# Patient Record
Sex: Female | Born: 1990 | Race: White | Hispanic: No | Marital: Single | Smoking: Current every day smoker
Health system: Southern US, Community
[De-identification: ages and names within clinical notes are randomized; demographics above are authoritative.]

## PROBLEM LIST (undated history)

## (undated) DIAGNOSIS — F329 Major depressive disorder, single episode, unspecified: Secondary | ICD-10-CM

## (undated) DIAGNOSIS — F1910 Other psychoactive substance abuse, uncomplicated: Secondary | ICD-10-CM

## (undated) DIAGNOSIS — K5903 Drug induced constipation: Secondary | ICD-10-CM

## (undated) DIAGNOSIS — M1990 Unspecified osteoarthritis, unspecified site: Secondary | ICD-10-CM

## (undated) DIAGNOSIS — F32A Depression, unspecified: Secondary | ICD-10-CM

## (undated) HISTORY — PX: NO PAST SURGERIES: SHX2092

---
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
## 2008-01-30 ENCOUNTER — Emergency Department: Payer: Self-pay | Admitting: Emergency Medicine

## 2008-05-05 ENCOUNTER — Ambulatory Visit: Payer: Self-pay | Admitting: Family Medicine

## 2008-07-19 ENCOUNTER — Inpatient Hospital Stay: Payer: Self-pay

## 2009-06-11 ENCOUNTER — Emergency Department: Payer: Self-pay | Admitting: Emergency Medicine

## 2009-10-12 ENCOUNTER — Emergency Department: Payer: Self-pay | Admitting: Internal Medicine

## 2009-11-03 ENCOUNTER — Emergency Department: Payer: Self-pay | Admitting: Emergency Medicine

## 2010-01-05 ENCOUNTER — Observation Stay: Payer: Self-pay | Admitting: Internal Medicine

## 2010-01-05 ENCOUNTER — Inpatient Hospital Stay: Payer: Self-pay | Admitting: Psychiatry
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
## 2014-02-22 ENCOUNTER — Emergency Department: Payer: Self-pay | Admitting: Student

## 2016-11-17 ENCOUNTER — Encounter: Payer: Self-pay | Admitting: Emergency Medicine

## 2016-11-17 ENCOUNTER — Emergency Department
Admission: EM | Admit: 2016-11-17 | Discharge: 2016-11-17 | Disposition: A | Discharge status: Home / Self Care | Payer: Medicaid Other | Attending: Emergency Medicine | Admitting: Emergency Medicine

## 2016-11-17 DIAGNOSIS — S50862A Insect bite (nonvenomous) of left forearm, initial encounter: Secondary | ICD-10-CM | POA: Diagnosis not present

## 2016-11-17 DIAGNOSIS — W57XXXA Bitten or stung by nonvenomous insect and other nonvenomous arthropods, initial encounter: Secondary | ICD-10-CM | POA: Diagnosis not present

## 2016-11-17 DIAGNOSIS — Y939 Activity, unspecified: Secondary | ICD-10-CM | POA: Diagnosis not present

## 2016-11-17 DIAGNOSIS — Y929 Unspecified place or not applicable: Secondary | ICD-10-CM | POA: Insufficient documentation

## 2016-11-17 DIAGNOSIS — R21 Rash and other nonspecific skin eruption: Secondary | ICD-10-CM | POA: Diagnosis present

## 2016-11-17 DIAGNOSIS — Y998 Other external cause status: Secondary | ICD-10-CM | POA: Insufficient documentation

## 2016-11-17 MED ORDER — TRIAMCINOLONE ACETONIDE 0.025 % EX OINT: 1.0000 "application " | TOPICAL_OINTMENT | Freq: Two times a day (BID) | CUTANEOUS | 0 refills | Status: DC

## 2016-11-17 MED ORDER — DIPHENHYDRAMINE HCL 25 MG PO CAPS: 25.0000 mg | ORAL_CAPSULE | ORAL | 0 refills | Status: DC | PRN

## 2016-11-17 MED ORDER — PERMETHRIN 5 % EX CREA: TOPICAL_CREAM | CUTANEOUS | 1 refills | Status: AC

## 2016-11-17 MED ORDER — PREDNISONE 20 MG PO TABS
60.0000 mg | ORAL_TABLET | Freq: Once | ORAL | Status: AC
Start: 2016-11-17 — End: 2016-11-17
  Administered 2016-11-17: 60 mg via ORAL
  Filled 2016-11-17: qty 3

## 2016-11-17 NOTE — ED Notes (Signed)
E-signature pad unavailable at this time.  Patient verbalized understanding of all discharge instructions and prescriptions.  

## 2016-11-17 NOTE — ED Triage Notes (Signed)
Rash to right forearm x 1 day

## 2016-11-17 NOTE — ED Provider Notes (Signed)
[facility] Emergency Department Provider Note  ____________________________________________  Time seen: Approximately 7:23 PM  I have reviewed the triage vital signs and the nursing notes.   HISTORY  Chief Complaint Rash    HPI Ashley Choi is a 26 y.o. female that presents to the emergency department with rash overleft forearm for 1 day. Rash itches. Patient states that daughter and partner have a similar rash. Daughter's rash began after coming home from father's house on Wednesday. Daughter was exposed to a dog with mange. She denies fever, shortness breath, chest pain, nausea, vomiting, abdominal pain.   History reviewed. No pertinent past medical history.  There are no active problems to display for this patient.   History reviewed. No pertinent surgical history.  Prior to Admission medications   Medication Sig Start Date End Date Taking? Authorizing Provider  diphenhydrAMINE (BENADRYL) 25 mg capsule Take 1 capsule (25 mg total) by mouth every 4 (four) hours as needed. 11/17/16 11/17/17  Enid Derry, PA-C  permethrin (ELIMITE) 5 % cream Message cream over area and leave on for 8-14 hours before removing. Repeat in 14 days if rash persists. 11/17/16 12/01/16  Enid Derry, PA-C  triamcinolone (KENALOG) 0.025 % ointment Apply 1 application topically 2 (two) times daily. 11/17/16   Enid Derry, PA-C    Allergies Patient has no known allergies.  No family history on file.  Social History Social History  Substance Use Topics  . Smoking status: Never Smoker  . Smokeless tobacco: Never Used  . Alcohol use Not on file     Review of Systems  Constitutional: No fever/chills Cardiovascular: No chest pain. Respiratory: No cough. No SOB. Gastrointestinal: No abdominal pain.  No nausea, no vomiting.  Musculoskeletal: Negative for musculoskeletal pain. Skin: Negative for abrasions, lacerations, ecchymosis. Positive for rash. Neurological:  Negative for headaches, numbness or tingling   ____________________________________________   PHYSICAL EXAM:  VITAL SIGNS: ED Triage Vitals [11/17/16 1821]  Enc Vitals Group     BP      Pulse      Resp      Temp      Temp src      SpO2      Weight 150 lb (68 kg)     Height 5\' 1"  (1.549 m)     Head Circumference      Peak Flow      Pain Score      Pain Loc      Pain Edu?      Excl. in GC?      Constitutional: Alert and oriented. Well appearing and in no acute distress. Eyes: Conjunctivae are normal. PERRL. EOMI. Head: Atraumatic. ENT:      Ears:      Nose: No congestion/rhinnorhea.      Mouth/Throat: Mucous membranes are moist.  Neck: No stridor.   Cardiovascular: Normal rate, regular rhythm.  Good peripheral circulation. Respiratory: Normal respiratory effort without tachypnea or retractions. Lungs CTAB. Good air entry to the bases with no decreased or absent breath sounds. Musculoskeletal: Full range of motion to all extremities. No gross deformities appreciated. Neurologic:  Normal speech and language. No gross focal neurologic deficits are appreciated.  Skin:  Skin is warm, dry and intact. Erythematous papules to left forearm. Psychiatric: Mood and affect are normal. Speech and behavior are normal. Patient exhibits appropriate insight and judgement.   ____________________________________________   LABS (all labs ordered are listed, but only abnormal results are displayed)  Labs Reviewed - No data to  display ____________________________________________  EKG   ____________________________________________  RADIOLOGY  No results found.  ____________________________________________    PROCEDURES  Procedure(s) performed:    Procedures    Medications  predniSONE (DELTASONE) tablet 60 mg (60 mg Oral Given 11/17/16 1938)     ____________________________________________   INITIAL IMPRESSION / ASSESSMENT AND PLAN / ED COURSE  Pertinent labs &  imaging results that were available during my care of the patient were reviewed by me and considered in my medical decision making (see chart for details).  Review of the [place] CSRS was performed in accordance of the [facility] prior to dispensing any controlled drugs.   Patient presented to the emergency department for evaluation of rash for 1 day. Vital signs and exam are reassuring. Daughter and partner have a similar rash. Daughter was exposed to a dog with mange. Daughter's rash consistent with scabies. I will cover patient for scabies. Patient will be discharged home with prescriptions for permethrin, triamcinolone, and Benadryl. Patient is to follow up with PCP as directed. Patient is given ED precautions to return to the ED for any worsening or new symptoms.     ____________________________________________  FINAL CLINICAL IMPRESSION(S) / ED DIAGNOSES  Final diagnoses:  Bug bite, initial encounter      NEW MEDICATIONS STARTED DURING THIS VISIT:  Discharge Medication List as of 11/17/2016  7:43 PM    START taking these medications   Details  diphenhydrAMINE (BENADRYL) 25 mg capsule Take 1 capsule (25 mg total) by mouth every 4 (four) hours as needed., Starting Fri 11/17/2016, Until Sat 11/17/2017, Print    permethrin (ELIMITE) 5 % cream Message cream over area and leave on for 8-14 hours before removing. Repeat in 14 days if rash persists., Print    triamcinolone (KENALOG) 0.025 % ointment Apply 1 application topically 2 (two) times daily., Starting Fri 11/17/2016, Print            This chart was dictated using voice recognition software/Dragon. Despite best efforts to proofread, errors can occur which can change the meaning. Any change was purely unintentional.    Enid DerryWagner, Jaquane Boughner, PA-C 11/17/16 2157    Nita SickleVeronese, [place], MD 11/17/16 [phone]

## 2017-07-13 ENCOUNTER — Other Ambulatory Visit: Payer: Self-pay

## 2017-07-13 ENCOUNTER — Emergency Department
Admission: EM | Admit: 2017-07-13 | Discharge: 2017-07-13 | Disposition: A | Discharge status: Home / Self Care | Payer: Medicaid Other | Attending: Emergency Medicine | Admitting: Emergency Medicine

## 2017-07-13 ENCOUNTER — Encounter: Payer: Self-pay | Admitting: Emergency Medicine

## 2017-07-13 DIAGNOSIS — R509 Fever, unspecified: Secondary | ICD-10-CM | POA: Insufficient documentation

## 2017-07-13 DIAGNOSIS — Z5321 Procedure and treatment not carried out due to patient leaving prior to being seen by health care provider: Secondary | ICD-10-CM | POA: Insufficient documentation

## 2017-07-13 DIAGNOSIS — R197 Diarrhea, unspecified: Secondary | ICD-10-CM | POA: Diagnosis not present

## 2017-07-13 DIAGNOSIS — R109 Unspecified abdominal pain: Secondary | ICD-10-CM | POA: Diagnosis present

## 2017-07-13 DIAGNOSIS — M791 Myalgia, unspecified site: Secondary | ICD-10-CM | POA: Insufficient documentation

## 2017-07-13 DIAGNOSIS — R05 Cough: Secondary | ICD-10-CM | POA: Insufficient documentation

## 2017-07-13 DIAGNOSIS — R112 Nausea with vomiting, unspecified: Secondary | ICD-10-CM | POA: Diagnosis not present

## 2017-07-13 LAB — CBC
HCT: 40.6 % (ref 35.0–47.0)
Hemoglobin: 14 g/dL (ref 12.0–16.0)
MCH: 29.8 pg (ref 26.0–34.0)
MCHC: 34.4 g/dL (ref 32.0–36.0)
MCV: 86.5 fL (ref 80.0–100.0)
PLATELETS: 463 10*3/uL — AB (ref 150–440)
RBC: 4.69 MIL/uL (ref 3.80–5.20)
RDW: 13.6 % (ref 11.5–14.5)
WBC: 13.1 10*3/uL — ABNORMAL HIGH (ref 3.6–11.0)

## 2017-07-13 LAB — URINALYSIS, COMPLETE (UACMP) WITH MICROSCOPIC
Bacteria, UA: NONE SEEN
Bilirubin Urine: NEGATIVE
GLUCOSE, UA: NEGATIVE mg/dL
KETONES UR: 5 mg/dL — AB
Nitrite: NEGATIVE
PH: 7 (ref 5.0–8.0)
Protein, ur: 30 mg/dL — AB
SPECIFIC GRAVITY, URINE: 1.034 — AB (ref 1.005–1.030)

## 2017-07-13 LAB — COMPREHENSIVE METABOLIC PANEL
ALK PHOS: 73 U/L (ref 38–126)
ALT: 21 U/L (ref 14–54)
AST: 25 U/L (ref 15–41)
Albumin: 4 g/dL (ref 3.5–5.0)
Anion gap: 11 (ref 5–15)
BUN: 11 mg/dL (ref 6–20)
CALCIUM: 9 mg/dL (ref 8.9–10.3)
CHLORIDE: 103 mmol/L (ref 101–111)
CO2: 22 mmol/L (ref 22–32)
CREATININE: 0.59 mg/dL (ref 0.44–1.00)
GFR calc non Af Amer: >60 mL/min (ref >60)
Glucose, Bld: 139 mg/dL — ABNORMAL HIGH (ref 65–99)
Potassium: 3.7 mmol/L (ref 3.5–5.1)
SODIUM: 136 mmol/L (ref 135–145)
Total Bilirubin: 0.7 mg/dL (ref 0.3–1.2)
Total Protein: 7.9 g/dL (ref 6.5–8.1)

## 2017-07-13 LAB — LIPASE, BLOOD: LIPASE: 23 U/L (ref 11–51)

## 2017-07-13 LAB — POC URINE PREG, ED: Preg Test, Ur: NEGATIVE

## 2017-07-13 LAB — TROPONIN I

## 2017-07-13 NOTE — ED Notes (Signed)
FN: pt notifies this RN that she has to leave to go pick up her daughter. Pt plans on coming back this evening. NAD.

## 2017-07-13 NOTE — ED Triage Notes (Signed)
Pt to ED from home with multiple complaints, generalized body aches, abd pain, productive white cough, n/v/d, and fevers that all started today when she woke up.  Taken IBU last this morning.

## 2017-07-16 ENCOUNTER — Telehealth: Payer: Self-pay | Admitting: Emergency Medicine

## 2017-07-16 NOTE — Telephone Encounter (Signed)
Called patient due to lwot to inquire about condition and follow up plans.No answer and voicemail is full. °

## 2017-08-11 ENCOUNTER — Emergency Department
Admission: EM | Admit: 2017-08-11 | Discharge: 2017-08-12 | Disposition: A | Discharge status: Home / Self Care | Payer: Medicaid Other | Attending: Emergency Medicine | Admitting: Emergency Medicine

## 2017-08-11 DIAGNOSIS — F17210 Nicotine dependence, cigarettes, uncomplicated: Secondary | ICD-10-CM | POA: Diagnosis not present

## 2017-08-11 DIAGNOSIS — Z79899 Other long term (current) drug therapy: Secondary | ICD-10-CM | POA: Insufficient documentation

## 2017-08-11 DIAGNOSIS — R45851 Suicidal ideations: Secondary | ICD-10-CM | POA: Insufficient documentation

## 2017-08-11 DIAGNOSIS — F10929 Alcohol use, unspecified with intoxication, unspecified: Secondary | ICD-10-CM | POA: Diagnosis not present

## 2017-08-11 DIAGNOSIS — F329 Major depressive disorder, single episode, unspecified: Secondary | ICD-10-CM | POA: Diagnosis present

## 2017-08-11 DIAGNOSIS — F1910 Other psychoactive substance abuse, uncomplicated: Secondary | ICD-10-CM | POA: Insufficient documentation

## 2017-08-11 DIAGNOSIS — F29 Unspecified psychosis not due to a substance or known physiological condition: Secondary | ICD-10-CM | POA: Insufficient documentation

## 2017-08-11 LAB — URINE DRUG SCREEN, QUALITATIVE (ARMC ONLY)
Amphetamines, Ur Screen: NOT DETECTED
BARBITURATES, UR SCREEN: NOT DETECTED
BENZODIAZEPINE, UR SCRN: POSITIVE — AB
CANNABINOID 50 NG, UR ~~LOC~~: POSITIVE — AB
Cocaine Metabolite,Ur ~~LOC~~: NOT DETECTED
MDMA (ECSTASY) UR SCREEN: NOT DETECTED
Methadone Scn, Ur: NOT DETECTED
Opiate, Ur Screen: POSITIVE — AB
Phencyclidine (PCP) Ur S: NOT DETECTED
TRICYCLIC, UR SCREEN: NOT DETECTED

## 2017-08-11 LAB — COMPREHENSIVE METABOLIC PANEL
ALK PHOS: 79 U/L (ref 38–126)
ALT: 13 U/L — ABNORMAL LOW (ref 14–54)
ANION GAP: 8 (ref 5–15)
AST: 17 U/L (ref 15–41)
Albumin: 4.1 g/dL (ref 3.5–5.0)
BILIRUBIN TOTAL: 0.5 mg/dL (ref 0.3–1.2)
BUN: <5 mg/dL — ABNORMAL LOW (ref 6–20)
CO2: 25 mmol/L (ref 22–32)
Calcium: 8.5 mg/dL — ABNORMAL LOW (ref 8.9–10.3)
Chloride: 104 mmol/L (ref 101–111)
Creatinine, Ser: 0.49 mg/dL (ref 0.44–1.00)
GFR calc non Af Amer: >60 mL/min (ref >60)
GLUCOSE: 106 mg/dL — AB (ref 65–99)
Potassium: 3.1 mmol/L — ABNORMAL LOW (ref 3.5–5.1)
Sodium: 137 mmol/L (ref 135–145)
TOTAL PROTEIN: 7.3 g/dL (ref 6.5–8.1)

## 2017-08-11 LAB — CBC
HCT: 38.1 % (ref 35.0–47.0)
Hemoglobin: 13.6 g/dL (ref 12.0–16.0)
MCH: 30.5 pg (ref 26.0–34.0)
MCHC: 35.7 g/dL (ref 32.0–36.0)
MCV: 85.5 fL (ref 80.0–100.0)
PLATELETS: 398 10*3/uL (ref 150–440)
RBC: 4.45 MIL/uL (ref 3.80–5.20)
RDW: 13.7 % (ref 11.5–14.5)
WBC: 13.2 10*3/uL — ABNORMAL HIGH (ref 3.6–11.0)

## 2017-08-11 LAB — ETHANOL: Alcohol, Ethyl (B): 166 mg/dL — ABNORMAL HIGH (ref <10)

## 2017-08-11 LAB — PREGNANCY, URINE: Preg Test, Ur: NEGATIVE

## 2017-08-11 MED ORDER — NICOTINE 21 MG/24HR TD PT24
21.0000 mg | MEDICATED_PATCH | Freq: Once | TRANSDERMAL | Status: DC
  Administered 2017-08-11: 21 mg via TRANSDERMAL
  Filled 2017-08-11: qty 1

## 2017-08-11 MED ORDER — CHLORDIAZEPOXIDE HCL 10 MG PO CAPS: 10.0000 mg | ORAL_CAPSULE | Freq: Three times a day (TID) | ORAL | Status: DC | PRN

## 2017-08-11 NOTE — ED Notes (Signed)
BEHAVIORAL HEALTH ROUNDING  Patient sleeping: No.  Patient alert and oriented: yes  Behavior appropriate: Yes. ; If no, describe:  Nutrition and fluids offered: Yes  Toileting and hygiene offered: Yes  Sitter present: not applicable, Q 15 min safety rounds and observation.  Law enforcement present: Yes [facility]  
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
## 2017-08-11 NOTE — ED Notes (Signed)

## 2017-08-11 NOTE — ED Triage Notes (Signed)
Patient reports today she has had Xanax, Klonopin, heroin, and alcohol.

## 2017-08-11 NOTE — ED Notes (Signed)
Pt changed into behavorial clothing by this tech and EDT Kadijah, pt belongings placed in a belongings bag and handed off to RN Dewayne HatchAnn, pt belongings listed on "securing of belongings" sheet.

## 2017-08-11 NOTE — ED Provider Notes (Addendum)
[facility] Emergency Department Provider Note  ____________________________________________  Time seen: Approximately 8:51 PM  I have reviewed the triage vital signs and the nursing notes.   HISTORY  Chief Complaint No chief complaint on file.    HPI Ashley Choi is a 27 y.o. female comes to the ED complaining of depression. She reports waking up crying every day for the past 2 weeks, anhedonia, difficulty sleeping, decreased appetite, depressed mood. Suicidal ideation without plan. Symptoms of been constant, waxing and waning, no aggravating or alleviating factors, severe.  patient also reports auditory hallucinations and feeling paranoid and afraid to go out of her house.  She reports that she is to be on antidepressants but stopped taking them a while ago.  Also drinks alcohol, snorts heroin, abuses Xanax whenever she can get it which is not every day. Last heroin was today. Last alcohol was today  Patient reports feeling anxious was causes her chest to be tight.      History reviewed. No pertinent past medical history.   There are no active problems to display for this patient.    History reviewed. No pertinent surgical history.   Prior to Admission medications   Medication Sig Start Date End Date Taking? Authorizing Provider  diphenhydrAMINE (BENADRYL) 25 mg capsule Take 1 capsule (25 mg total) by mouth every 4 (four) hours as needed. 11/17/16 11/17/17  Enid Derry, PA-C  triamcinolone (KENALOG) 0.025 % ointment Apply 1 application topically 2 (two) times daily. 11/17/16   Enid Derry, PA-C     Allergies Patient has no known allergies.   No family history on file.  Social History Social History   Tobacco Use  . Smoking status: Current Every Day Smoker    Packs/day: 0.50    Types: Cigarettes  . Smokeless tobacco: Never Used  Substance Use Topics  . Alcohol use: No    Frequency: Never  . Drug use: Yes    Comment: heroin     Review of Systems  Constitutional:   No fever or chills.  ENT:   No sore throat. No rhinorrhea. Cardiovascular:  positive chest tightness as above, no syncope. Respiratory:   No dyspnea or cough. Gastrointestinal:   Negative for abdominal pain, vomiting and diarrhea.  Musculoskeletal:   Negative for focal pain or swelling All other systems reviewed and are negative except as documented above in ROS and HPI.  ____________________________________________   PHYSICAL EXAM:  VITAL SIGNS: ED Triage Vitals  Enc Vitals Group     BP 08/11/17 1941 140/76     Pulse Rate 08/11/17 1941 (!) 112     Resp 08/11/17 1941 18     Temp 08/11/17 1941 98.4 F (36.9 C)     Temp Source 08/11/17 1941 Oral     SpO2 08/11/17 1941 95 %     Weight 08/11/17 1941 170 lb (77.1 kg)     Height 08/11/17 1941 5\' 1"  (1.549 m)     Head Circumference --      Peak Flow --      Pain Score 08/11/17 1952 0     Pain Loc --      Pain Edu? --      Excl. in GC? --     Vital signs reviewed, nursing assessments reviewed.   Constitutional:   Alert and oriented. Well appearing and in no distress. tearful Eyes:   Conjunctivae are normal. EOMI. PERRL. ENT      Head:   Normocephalic and atraumatic.  Nose:   No congestion/rhinnorhea.       Mouth/Throat:   MMM, no pharyngeal erythema. No peritonsillar mass.       Neck:   No meningismus. Full ROM. Hematological/Lymphatic/Immunilogical:   No cervical lymphadenopathy. Cardiovascular:   RRR. Symmetric bilateral radial and DP pulses.  No murmurs.  Respiratory:   Normal respiratory effort without tachypnea/retractions. Breath sounds are clear and equal bilaterally. No wheezes/rales/rhonchi. Gastrointestinal:   Soft and nontender. Non distended. There is no CVA tenderness.  No rebound, rigidity, or guarding. Genitourinary:   deferred Musculoskeletal:   Normal range of motion in all extremities. No joint effusions.  No lower extremity tenderness.  No edema. Neurologic:    slurred speech.  Motor grossly intact. No acute focal neurologic deficits are appreciated.  Skin:    Skin is warm, dry and intact. No rash noted.  No petechiae, purpura, or bullae.no apparent injuries  ____________________________________________    LABS (pertinent positives/negatives) (all labs ordered are listed, but only abnormal results are displayed) Labs Reviewed  COMPREHENSIVE METABOLIC PANEL - Abnormal; Notable for the following components:      Result Value   Potassium 3.1 (*)    Glucose, Bld 106 (*)    BUN <5 (*)    Calcium 8.5 (*)    ALT 13 (*)    All other components within normal limits  ETHANOL - Abnormal; Notable for the following components:   Alcohol, Ethyl (B) 166 (*)    All other components within normal limits  CBC - Abnormal; Notable for the following components:   WBC 13.2 (*)    All other components within normal limits  URINE DRUG SCREEN, QUALITATIVE ([facility] ONLY)  PREGNANCY, URINE  POC URINE PREG, ED   ____________________________________________   EKG  interpreted by me Normal sinus rhythm rate of 98, normal axis intervals QRS ST segments and T waves.  ____________________________________________    RADIOLOGY  No results found.  ____________________________________________   PROCEDURES Procedures  ____________________________________________  DIFFERENTIAL DIAGNOSIS   psychosis, sepsis induced psychiatric disorder, polysubstance abuse, major depression  CLINICAL IMPRESSION / ASSESSMENT AND PLAN / ED COURSE  Pertinent labs & imaging results that were available during my care of the patient were reviewed by me and considered in my medical decision making (see chart for details).    patient well appearing not in distress, medically stable, unremarkable vital signs. Triage vitals found her tachycardic at 112, but this is not reproduced on my exam, her heart rate for me is about 90. She is anxious appearing and emotionally upset. Labs  show elevated alcohol level, she does exhibit some signs of intoxication. We will retain her in the ED until she is sober and can pursue a psychiatry evaluation. Involuntary commitment in the meantime given polysubstance abuse and danger to herself and psychotic symptoms.  Clinical Course as of Aug 12 2103  Sat Aug 11, 2017  2105 Tox screen positive for opiate, benzo, marijuana. Alcohol elevated.    [PS]    Clinical Course User Index [PS] Sharman CheekStafford, Trishia Cuthrell, MD     ____________________________________________   FINAL CLINICAL IMPRESSION(S) / ED DIAGNOSES    Final diagnoses:  Suicidal ideation  Polysubstance abuse (HCC)  Alcoholic intoxication with complication (HCC)  Psychosis, unspecified psychosis type [facility](HCC)     ED Discharge Orders    None      Portions of this note were generated with dragon dictation software. Dictation errors may occur despite best attempts at proofreading.    Sharman CheekStafford, Marlo Arriola, MD 08/11/17 2056  Sharman Cheek, MD 08/11/17 2105

## 2017-08-12 NOTE — ED Notes (Signed)
BEHAVIORAL HEALTH ROUNDING  Patient sleeping: No.  Patient alert and oriented: yes  Behavior appropriate: Yes. ; If no, describe:  Nutrition and fluids offered: Yes  Toileting and hygiene offered: Yes  Sitter present: not applicable, Q 15 min safety rounds and observation.  Law enforcement present: Yes [facility]  

## 2017-08-12 NOTE — ED Provider Notes (Signed)
-----------------------------------------   1:26 AM on 08/12/2017 -----------------------------------------  [facility] Dr[facility] Orpah Clintonollin who has rescinded her IVC and deems her psychiatrically stable for discharge home with mental health/substance abuse follow-up.  No medication recommendations at this time.   Irean HongSung, Jade J, MD 08/12/17 [phone]

## 2017-08-12 NOTE — Discharge Instructions (Addendum)
Please call the number provided to seek counseling for substance abuse and depression.  Return to the ER for worsening symptoms, persistent vomiting, difficulty breathing, feelings of hurting yourself or others, or other concerns.

## 2017-09-26 ENCOUNTER — Emergency Department: Payer: Medicaid Other

## 2017-09-26 ENCOUNTER — Encounter: Payer: Self-pay | Admitting: Emergency Medicine

## 2017-09-26 ENCOUNTER — Inpatient Hospital Stay
Admission: EM | Admit: 2017-09-26 | Discharge: 2017-09-28 | DRG: 917 | Disposition: A | Discharge status: Home / Self Care | Payer: Medicaid Other | Attending: Internal Medicine | Admitting: Internal Medicine

## 2017-09-26 ENCOUNTER — Inpatient Hospital Stay: Payer: Medicaid Other

## 2017-09-26 DIAGNOSIS — G92 Toxic encephalopathy: Secondary | ICD-10-CM | POA: Diagnosis present

## 2017-09-26 DIAGNOSIS — F1910 Other psychoactive substance abuse, uncomplicated: Secondary | ICD-10-CM | POA: Diagnosis present

## 2017-09-26 DIAGNOSIS — T43621A Poisoning by amphetamines, accidental (unintentional), initial encounter: Secondary | ICD-10-CM | POA: Diagnosis present

## 2017-09-26 DIAGNOSIS — T407X1A Poisoning by cannabis (derivatives), accidental (unintentional), initial encounter: Secondary | ICD-10-CM | POA: Diagnosis present

## 2017-09-26 DIAGNOSIS — I959 Hypotension, unspecified: Secondary | ICD-10-CM | POA: Diagnosis present

## 2017-09-26 DIAGNOSIS — J9600 Acute respiratory failure, unspecified whether with hypoxia or hypercapnia: Secondary | ICD-10-CM

## 2017-09-26 DIAGNOSIS — G9340 Encephalopathy, unspecified: Secondary | ICD-10-CM

## 2017-09-26 DIAGNOSIS — T424X1A Poisoning by benzodiazepines, accidental (unintentional), initial encounter: Principal | ICD-10-CM | POA: Diagnosis present

## 2017-09-26 DIAGNOSIS — T405X1A Poisoning by cocaine, accidental (unintentional), initial encounter: Secondary | ICD-10-CM | POA: Diagnosis present

## 2017-09-26 DIAGNOSIS — F17210 Nicotine dependence, cigarettes, uncomplicated: Secondary | ICD-10-CM | POA: Diagnosis present

## 2017-09-26 DIAGNOSIS — T40601A Poisoning by unspecified narcotics, accidental (unintentional), initial encounter: Secondary | ICD-10-CM | POA: Diagnosis present

## 2017-09-26 DIAGNOSIS — R4182 Altered mental status, unspecified: Secondary | ICD-10-CM | POA: Diagnosis present

## 2017-09-26 DIAGNOSIS — T884XXA Failed or difficult intubation, initial encounter: Secondary | ICD-10-CM

## 2017-09-26 LAB — AMMONIA: Ammonia: 23 umol/L (ref 9–35)

## 2017-09-26 LAB — ACETAMINOPHEN LEVEL: Acetaminophen (Tylenol), Serum: <10 ug/mL — ABNORMAL LOW (ref 10–30)

## 2017-09-26 LAB — CBC
HCT: 38 % (ref 35.0–47.0)
Hemoglobin: 13.1 g/dL (ref 12.0–16.0)
MCH: 29.9 pg (ref 26.0–34.0)
MCHC: 34.5 g/dL (ref 32.0–36.0)
MCV: 86.7 fL (ref 80.0–100.0)
PLATELETS: 399 10*3/uL (ref 150–440)
RBC: 4.38 MIL/uL (ref 3.80–5.20)
RDW: 14.5 % (ref 11.5–14.5)
WBC: 8 10*3/uL (ref 3.6–11.0)

## 2017-09-26 LAB — URINALYSIS, COMPLETE (UACMP) WITH MICROSCOPIC
Bacteria, UA: NONE SEEN
Bilirubin Urine: NEGATIVE
GLUCOSE, UA: NEGATIVE mg/dL
Ketones, ur: 20 mg/dL — AB
LEUKOCYTES UA: NEGATIVE
NITRITE: NEGATIVE
Protein, ur: 100 mg/dL — AB
Specific Gravity, Urine: 1.027 (ref 1.005–1.030)
pH: 5 (ref 5.0–8.0)

## 2017-09-26 LAB — BLOOD GAS, ARTERIAL
Acid-base deficit: 1.1 mmol/L (ref 0.0–2.0)
Bicarbonate: 22.4 mmol/L (ref 20.0–28.0)
Drawn by: 28459
FIO2: 100
O2 Saturation: 100 %
PCO2 ART: 33 mmHg (ref 32.0–48.0)
PEEP: 5 cmH2O
Patient temperature: 37
RATE: 16 resp/min
VT: 450 mL
pH, Arterial: 7.44 (ref 7.350–7.450)
pO2, Arterial: 438 mmHg — ABNORMAL HIGH (ref 83.0–108.0)

## 2017-09-26 LAB — COMPREHENSIVE METABOLIC PANEL
ALK PHOS: 75 U/L (ref 38–126)
ALT: 11 U/L — ABNORMAL LOW (ref 14–54)
ANION GAP: 5 (ref 5–15)
AST: 19 U/L (ref 15–41)
Albumin: 3.8 g/dL (ref 3.5–5.0)
BUN: 8 mg/dL (ref 6–20)
CALCIUM: 8.8 mg/dL — AB (ref 8.9–10.3)
CO2: 25 mmol/L (ref 22–32)
Chloride: 109 mmol/L (ref 101–111)
Creatinine, Ser: 0.6 mg/dL (ref 0.44–1.00)
GFR calc non Af Amer: >60 mL/min (ref >60)
Glucose, Bld: 115 mg/dL — ABNORMAL HIGH (ref 65–99)
POTASSIUM: 4 mmol/L (ref 3.5–5.1)
SODIUM: 139 mmol/L (ref 135–145)
Total Bilirubin: 0.6 mg/dL (ref 0.3–1.2)
Total Protein: 7.2 g/dL (ref 6.5–8.1)

## 2017-09-26 LAB — URINE DRUG SCREEN, QUALITATIVE (ARMC ONLY)
AMPHETAMINES, UR SCREEN: POSITIVE — AB
BENZODIAZEPINE, UR SCRN: POSITIVE — AB
Barbiturates, Ur Screen: NOT DETECTED
Cannabinoid 50 Ng, Ur ~~LOC~~: POSITIVE — AB
Cocaine Metabolite,Ur ~~LOC~~: POSITIVE — AB
MDMA (ECSTASY) UR SCREEN: NOT DETECTED
METHADONE SCREEN, URINE: NOT DETECTED
OPIATE, UR SCREEN: POSITIVE — AB
Phencyclidine (PCP) Ur S: NOT DETECTED
Tricyclic, Ur Screen: NOT DETECTED

## 2017-09-26 LAB — CK: CK TOTAL: 120 U/L (ref 38–234)

## 2017-09-26 LAB — BASIC METABOLIC PANEL
Anion gap: 6 (ref 5–15)
BUN: 6 mg/dL (ref 6–20)
CO2: 22 mmol/L (ref 22–32)
CREATININE: 0.58 mg/dL (ref 0.44–1.00)
Calcium: 8.5 mg/dL — ABNORMAL LOW (ref 8.9–10.3)
Chloride: 118 mmol/L — ABNORMAL HIGH (ref 101–111)
GFR calc Af Amer: >60 mL/min (ref >60)
GFR calc non Af Amer: >60 mL/min (ref >60)
Glucose, Bld: 109 mg/dL — ABNORMAL HIGH (ref 65–99)
Potassium: 3.3 mmol/L — ABNORMAL LOW (ref 3.5–5.1)
SODIUM: 146 mmol/L — AB (ref 135–145)

## 2017-09-26 LAB — MRSA PCR SCREENING: MRSA BY PCR: NEGATIVE

## 2017-09-26 LAB — LACTIC ACID, PLASMA: Lactic Acid, Venous: 1.2 mmol/L (ref 0.5–1.9)

## 2017-09-26 LAB — POCT PREGNANCY, URINE: PREG TEST UR: NEGATIVE

## 2017-09-26 LAB — ETHANOL: Alcohol, Ethyl (B): <10 mg/dL (ref <10)

## 2017-09-26 LAB — GLUCOSE, CAPILLARY: GLUCOSE-CAPILLARY: 114 mg/dL — AB (ref 65–99)

## 2017-09-26 MED ORDER — LORAZEPAM 2 MG/ML IJ SOLN
1.0000 mg | Freq: Once | INTRAMUSCULAR | Status: AC
Start: 2017-09-26 — End: 2017-09-26
  Administered 2017-09-26: 1 mg via INTRAVENOUS

## 2017-09-26 MED ORDER — MIDAZOLAM HCL 5 MG/5ML IJ SOLN
INTRAMUSCULAR | Status: AC
Start: 2017-09-26 — End: 2017-09-27
  Filled 2017-09-26: qty 5

## 2017-09-26 MED ORDER — HALOPERIDOL LACTATE 5 MG/ML IJ SOLN
3.0000 mg | Freq: Once | INTRAMUSCULAR | Status: AC
  Administered 2017-09-26: 3 mg via INTRAVENOUS

## 2017-09-26 MED ORDER — ETOMIDATE 2 MG/ML IV SOLN
20.0000 mg | Freq: Once | INTRAVENOUS | Status: AC
  Administered 2017-09-26: 20 mg via INTRAVENOUS

## 2017-09-26 MED ORDER — THIAMINE HCL 100 MG/ML IJ SOLN
Freq: Once | INTRAVENOUS | Status: AC
  Administered 2017-09-26: 12:00:00 via INTRAVENOUS
  Filled 2017-09-26: qty 1000

## 2017-09-26 MED ORDER — HALOPERIDOL LACTATE 5 MG/ML IJ SOLN
2.0000 mg | Freq: Once | INTRAMUSCULAR | Status: AC
  Administered 2017-09-26: 2 mg via INTRAVENOUS
  Filled 2017-09-26: qty 1

## 2017-09-26 MED ORDER — SODIUM CHLORIDE 0.9 % IV BOLUS
1000.0000 mL | Freq: Once | INTRAVENOUS | Status: AC
  Administered 2017-09-26: 1000 mL via INTRAVENOUS

## 2017-09-26 MED ORDER — ENOXAPARIN SODIUM 40 MG/0.4ML ~~LOC~~ SOLN
40.0000 mg | SUBCUTANEOUS | Status: DC
  Administered 2017-09-26 – 2017-09-27 (×2): 40 mg via SUBCUTANEOUS
  Filled 2017-09-26 (×2): qty 0.4

## 2017-09-26 MED ORDER — DEXMEDETOMIDINE HCL IN NACL 400 MCG/100ML IV SOLN
0.4000 ug/kg/h | INTRAVENOUS | Status: DC
Start: 2017-09-26 — End: 2017-09-27
  Administered 2017-09-26: 0.5 ug/kg/h via INTRAVENOUS
  Filled 2017-09-26: qty 100

## 2017-09-26 MED ORDER — FENTANYL CITRATE (PF) 100 MCG/2ML IJ SOLN
100.0000 ug | INTRAMUSCULAR | Status: DC | PRN
  Administered 2017-09-26 – 2017-09-27 (×3): 100 ug via INTRAVENOUS
  Filled 2017-09-26 (×3): qty 2

## 2017-09-26 MED ORDER — MIDAZOLAM HCL 2 MG/2ML IJ SOLN
2.0000 mg | Freq: Once | INTRAMUSCULAR | Status: AC
  Administered 2017-09-26: 2 mg via INTRAVENOUS

## 2017-09-26 MED ORDER — LORAZEPAM 2 MG/ML IJ SOLN
2.0000 mg | Freq: Once | INTRAMUSCULAR | Status: AC
  Administered 2017-09-26: 2 mg via INTRAVENOUS

## 2017-09-26 MED ORDER — LORAZEPAM 2 MG/ML IJ SOLN
INTRAMUSCULAR | Status: AC
Start: 2017-09-26 — End: 2017-09-27
  Filled 2017-09-26: qty 1

## 2017-09-26 MED ORDER — SODIUM CHLORIDE 0.9 % IV SOLN
INTRAVENOUS | Status: DC
  Administered 2017-09-26: 125 mL/h via INTRAVENOUS

## 2017-09-26 MED ORDER — LORAZEPAM 2 MG/ML IJ SOLN
INTRAMUSCULAR | Status: AC
  Administered 2017-09-26: 1 mg via INTRAVENOUS
  Filled 2017-09-26: qty 1

## 2017-09-26 MED ORDER — ZIPRASIDONE MESYLATE 20 MG IM SOLR
20.0000 mg | Freq: Once | INTRAMUSCULAR | Status: AC
  Administered 2017-09-26: 20 mg via INTRAMUSCULAR

## 2017-09-26 MED ORDER — ACETAMINOPHEN 325 MG PO TABS
650.0000 mg | ORAL_TABLET | Freq: Four times a day (QID) | ORAL | Status: DC | PRN
Start: 2017-09-26 — End: 2017-09-28

## 2017-09-26 MED ORDER — ACETAMINOPHEN 650 MG RE SUPP: 650.0000 mg | Freq: Four times a day (QID) | RECTAL | Status: DC | PRN

## 2017-09-26 MED ORDER — VECURONIUM BROMIDE 10 MG IV SOLR
10.0000 mg | Freq: Once | INTRAVENOUS | Status: AC
  Administered 2017-09-26: 10 mg via INTRAVENOUS
  Filled 2017-09-26: qty 10

## 2017-09-26 MED ORDER — ONDANSETRON HCL 4 MG PO TABS: 4.0000 mg | ORAL_TABLET | Freq: Four times a day (QID) | ORAL | Status: DC | PRN

## 2017-09-26 MED ORDER — KETAMINE HCL 10 MG/ML IJ SOLN
INTRAMUSCULAR | Status: AC
Start: 2017-09-26 — End: 2017-09-27
  Filled 2017-09-26: qty 1

## 2017-09-26 MED ORDER — ONDANSETRON HCL 4 MG/2ML IJ SOLN
4.0000 mg | Freq: Four times a day (QID) | INTRAMUSCULAR | Status: DC | PRN
  Administered 2017-09-27: 4 mg via INTRAVENOUS
  Filled 2017-09-26: qty 2

## 2017-09-26 MED ORDER — KETAMINE HCL 10 MG/ML IJ SOLN
80.0000 mg | Freq: Once | INTRAMUSCULAR | Status: AC
  Administered 2017-09-26: 80 mg via INTRAVENOUS

## 2017-09-26 MED ORDER — FAMOTIDINE IN NACL 20-0.9 MG/50ML-% IV SOLN
20.0000 mg | INTRAVENOUS | Status: DC
  Administered 2017-09-26: 20 mg via INTRAVENOUS
  Filled 2017-09-26: qty 50

## 2017-09-26 MED ORDER — ZIPRASIDONE MESYLATE 20 MG IM SOLR
INTRAMUSCULAR | Status: AC
Start: 2017-09-26 — End: 2017-09-27
  Filled 2017-09-26: qty 20

## 2017-09-26 MED ORDER — AMPICILLIN-SULBACTAM SODIUM 3 (2-1) G IJ SOLR
3.0000 g | Freq: Four times a day (QID) | INTRAMUSCULAR | Status: DC
  Administered 2017-09-26 – 2017-09-27 (×2): 3 g via INTRAVENOUS
  Filled 2017-09-26 (×5): qty 3

## 2017-09-26 MED ORDER — FENTANYL CITRATE (PF) 100 MCG/2ML IJ SOLN
100.0000 ug | INTRAMUSCULAR | Status: DC | PRN
  Administered 2017-09-27 (×2): 100 ug via INTRAVENOUS
  Filled 2017-09-26 (×2): qty 2

## 2017-09-26 MED ORDER — ROCURONIUM BROMIDE 50 MG/5ML IV SOLN
100.0000 mg | Freq: Once | INTRAVENOUS | Status: AC
  Administered 2017-09-26: 100 mg via INTRAVENOUS

## 2017-09-26 NOTE — Progress Notes (Signed)
ETT secured at the 25cm mark. Following xray ETT returned to 23cm mark as indicated at intubation.

## 2017-09-26 NOTE — ED Notes (Signed)
Pt urinated all over herself again. Pt cleaned up and clean gown placed on pt. Diaper placed on pt at this time. Pt is throwing herself around in the bed at this time. Pt is extremely restless at this time.

## 2017-09-26 NOTE — ED Notes (Signed)
Pt extremely combative and agitated at this time. Multiple staff including MD York Cerise at bedside assisting with controlling pt's movements.

## 2017-09-26 NOTE — Progress Notes (Signed)
ANTIBIOTIC CONSULT NOTE - INITIAL  Pharmacy Consult for Unasyn  Indication: aspiration PNA   No Known Allergies  Patient Measurements: Height:  (157.5 cm) Weight: 156 lb 15.5 oz (71.2 kg) IBW/kg (Calculated) : 50.1 Adjusted Body Weight:   Vital Signs: Temp: 98.8 F (37.1 C) (05/22 2209) Temp Source: Oral (05/22 2209) BP: 140/95 (05/22 2209) Pulse Rate: 110 (05/22 2209) Intake/Output from previous day: No intake/output data recorded. Intake/Output from this shift: No intake/output data recorded.  Labs: Recent Labs    09/26/17 1110 09/26/17 2015  WBC 8.0  --   HGB 13.1  --   PLT 399  --   CREATININE 0.60 0.58   Estimated Creatinine Clearance: 96.7 mL/min (by C-G formula based on SCr of 0.58 mg/dL). No results for input(s): VANCOTROUGH, VANCOPEAK, VANCORANDOM, GENTTROUGH, GENTPEAK, GENTRANDOM, TOBRATROUGH, TOBRAPEAK, TOBRARND, AMIKACINPEAK, AMIKACINTROU, AMIKACIN in the last 72 hours.   Microbiology: No results found for this or any previous visit (from the past 720 hour(s)).  Medical History: History reviewed. No pertinent past medical history.  Medications:  No medications prior to admission.   Assessment: CrCl = 96.7 ml/min   Goal of Therapy:  Resolution of infection  Plan:  Expected duration 7 days with resolution of temperature and/or normalization of WBC   Unasyn 3 gm IV Q6H ordered to start on 5/22 @ 22:30.   Sigfredo Schreier D 09/26/2017,10:18 PM

## 2017-09-26 NOTE — ED Notes (Signed)
Bilateral breath sounds at this time per MD York Cerise

## 2017-09-26 NOTE — ED Notes (Signed)
Pt still combative and agitated at this time

## 2017-09-26 NOTE — ED Notes (Addendum)
Pt's valuables locked up in security box by [facility].  $44.00 [employer] card EBT card [place] Drivers license  Key #09 in pyxis  Ticket # [id number]

## 2017-09-26 NOTE — ED Provider Notes (Signed)
[facility] Emergency Department Provider Note  ____________________________________________  Time seen: Approximately 11:05 AM  I have reviewed the triage vital signs and the nursing notes.   HISTORY  Chief Complaint Altered Mental Status    HPI Ashley Choi is a 27 y.o. female is unable to give any history brought by EMS for unresponsiveness.  EMS was called and the patient was found on the side of the road intermittently combative and intermittently unresponsive.  There was a report of possible heroin or cocaine use.  She was nonverbal.  Her blood sugar was within normal limits and she was tachycardic but otherwise had a reassuring blood pressure in route.  On arrival to the emergency department, the patient has intermittent episodes of flailing of the limbs, is protective of her airway, but is nonverbal and unable to or unwilling to respond to questions per  History reviewed. No pertinent past medical history.  There are no active problems to display for this patient.   History reviewed. No pertinent surgical history.    Allergies Patient has no known allergies.  No family history on file.  Social History Social History   Tobacco Use  . Smoking status: Current Every Day Smoker    Packs/day: 0.50    Types: Cigarettes  . Smokeless tobacco: Never Used  Substance Use Topics  . Alcohol use: No    Frequency: Never  . Drug use: Yes    Comment: heroin    Review of Systems Able to obtain due to patient unresponsive. ____________________________________________   PHYSICAL EXAM:  VITAL SIGNS: ED Triage Vitals [09/26/17 1059]  Enc Vitals Group     BP 106/68     Pulse Rate (!) 118     Resp 20     Temp 97.6 F (36.4 C)     Temp Source Axillary     SpO2 94 %     Weight      Height      Head Circumference      Peak Flow      Pain Score      Pain Loc      Pain Edu?      Excl. in GC?     Constitutional: The patient is intermittently  unresponsive and intermittently combative.  She is protecting her airway.  She is not verbally responsive. Eyes: Conjunctivae are normal.  EOMI. pupils are 4 mm and reactive bilaterally, symmetric.  No scleral icterus. Head: Atraumatic.  No battle sign.   Nose: No congestion/rhinnorhea.  No swelling over the nose or septal hematoma. Mouth/Throat: Mucous membranes are dry.  Neck: No stridor.  Supple.  No meningismus. Cardiovascular: Fast rate, regular rhythm. No murmurs, rubs or gallops.  Respiratory: Normal respiratory effort.  No accessory muscle use or retractions. Lungs CTAB.  No wheezes, rales or ronchi. Gastrointestinal: Soft, nontender and nondistended.  No guarding or rebound.  No peritoneal signs. Musculoskeletal: No LE edema. Neurologic:  A&Ox3.  Speech is clear.  Face and smile are symmetric.  EOMI.  Moves all extremities well. Skin:  Skin is warm, dry and intact. No rash noted. Psych: Able to fully assess due to patient unresponsive; agitation.  ____________________________________________   LABS (all labs ordered are listed, but only abnormal results are displayed)  Labs Reviewed  COMPREHENSIVE METABOLIC PANEL - Abnormal; Notable for the following components:      Result Value   Glucose, Bld 115 (*)    Calcium 8.8 (*)    ALT 11 (*)  All other components within normal limits  URINE DRUG SCREEN, QUALITATIVE ([facility] ONLY) - Abnormal; Notable for the following components:   Amphetamines, Ur Screen POSITIVE (*)    Cocaine Metabolite,Ur [place] POSITIVE (*)    Opiate, Ur Screen POSITIVE (*)    Cannabinoid 50 Ng, Ur [place] POSITIVE (*)    Benzodiazepine, Ur Scrn POSITIVE (*)    All other components within normal limits  ACETAMINOPHEN LEVEL - Abnormal; Notable for the following components:   Acetaminophen (Tylenol), Serum <10 (*)    All other components within normal limits  URINALYSIS, COMPLETE (UACMP) WITH MICROSCOPIC - Abnormal; Notable for the following components:   Color, Urine  AMBER (*)    APPearance CLOUDY (*)    Hgb urine dipstick SMALL (*)    Ketones, ur 20 (*)    Protein, ur 100 (*)    All other components within normal limits  CBC  ETHANOL  AMMONIA  LACTIC ACID, PLASMA  POC URINE PREG, ED  POCT PREGNANCY, URINE   ____________________________________________  EKG  ED ECG REPORT I, Rockne Menghini, the attending physician, personally viewed and interpreted this ECG.   Date: 09/26/2017  EKG Time: 116  Rate: 114  Rhythm: normal sinus rhythm  Axis: normal  Intervals:none  ST&T Change: No STEMI  ____________________________________________  RADIOLOGY  Ct Head Wo Contrast  Result Date: 09/26/2017 CLINICAL DATA:  Altered mental status. EXAM: CT HEAD WITHOUT CONTRAST TECHNIQUE: Contiguous axial images were obtained from the base of the skull through the vertex without intravenous contrast. COMPARISON:  CT scan of January 05, 2010. FINDINGS: Brain: No evidence of acute infarction, hemorrhage, hydrocephalus, extra-axial collection or mass lesion/mass effect. Vascular: No hyperdense vessel or unexpected calcification. Skull: Normal. Negative for fracture or focal lesion. Sinuses/Orbits: Bilateral maxillary sinusitis is noted. Other: None. IMPRESSION: Mild bilateral maxillary sinusitis. No acute intracranial abnormality seen. Electronically Signed   By: Lupita Raider, M.D.   On: 09/26/2017 12:11   Dg Chest Portable 1 View  Result Date: 09/26/2017 CLINICAL DATA:  Altered mental status EXAM: PORTABLE CHEST 1 VIEW COMPARISON:  02/22/2014 FINDINGS: Motion degraded image of the chest.  No large consolidation. IMPRESSION: Severely motion degraded image of the chest without visible consolidation. Electronically Signed   By: Deatra Robinson M.D.   On: 09/26/2017 13:36    ____________________________________________   PROCEDURES  Procedure(s) performed: None  Procedures  Critical Care performed:  No ____________________________________________   INITIAL IMPRESSION / ASSESSMENT AND PLAN / ED COURSE  Pertinent labs & imaging results that were available during my care of the patient were reviewed by me and considered in my medical decision making (see chart for details).  27 y.o. female with a reported history of drug abuse presenting for unresponsiveness with intermittent episodes of combativeness.  Unfortunately, I am unable to get any history from the patient.  We will consider multiple possible etiologies today.  The most likely etiology is intoxication, consider cocaine use; pupils are not suggestive of severe opioid intoxication.  Alcohol use is also possible.  Other tox syndromes including salicylate overdose is considered.  An infectious source is considered although the patient has no evidence of meningismus or fever; this is lower on the differential but we will rule out pneumonia, urinary tract infection.  Meningitis is considered but much less likely.  We will also check an ammonia level.  At this time, the patient will receive intravenous fluids, and undergo full evaluation.  Plan re-evaluation for final disposition.  ----------------------------------------- 1:43 PM on 09/26/2017 -----------------------------------------  The patient's tachycardia is improving with fluids.  She does continue to have intermittent flailing without tonic-clonic movement, and continues to be nonverbal.  Her work-up in the emergency department is positive for polysubstance abuse with a urine drug screen that is positive for cocaine, amphetamines, opiates, marijuana, and benzodiazepines.  Her salicylate and Tylenol levels as well as alcohol levels are normal.  Her basic laboratory studies are reassuring.  She does not have any evidence of UTI.  She has a CT scan which does not show any acute intracranial process.  The patient will continue to be monitored until she is clinically sober for  discharge.  ----------------------------------------- 3:19 PM on 09/26/2017 -----------------------------------------  The patient became slightly hypotensive while she was sleeping, but additional intravenous fluid has been ordered and she is responding to it.  The patient has been signed out to Dr. York Cerise, who will continue to monitor the patient for progress and final disposition.  ____________________________________________  FINAL CLINICAL IMPRESSION(S) / ED DIAGNOSES  Final diagnoses:  Polysubstance abuse (HCC)  Altered mental status, unspecified altered mental status type  Hypotension, unspecified hypotension type         NEW MEDICATIONS STARTED DURING THIS VISIT:  New Prescriptions   No medications on file      Rockne Menghini, MD 09/26/17 1521

## 2017-09-26 NOTE — ED Notes (Signed)
Pt combative and swinging arms and legs around at this time. IV fluids Medication not started at this time. Will notify MD Sharma Covert

## 2017-09-26 NOTE — ED Notes (Signed)
Pt is urinating all over herself. Pt is still being combative with sitter. Verbal order for medication

## 2017-09-26 NOTE — ED Notes (Signed)
1 mg ativan given to patient by RN Corrie Dandy at this time. Verbal order given by MD Sharma Covert

## 2017-09-26 NOTE — ED Notes (Signed)
Dr. Sharma Covert informed that pts blood pressure 89/49, verbal order given for 1 liter NS bolus.

## 2017-09-26 NOTE — ED Notes (Signed)
Pt's O2 88-89%. 2L O2 applied to patient nasal cannula at this time.

## 2017-09-26 NOTE — ED Notes (Signed)
Pt fighting tube.  Spoke with Dr. York Cerise.  VO given for  IV vecuronium for transport.

## 2017-09-26 NOTE — ED Notes (Signed)
Pt urinated herself while in the bed again. This tech was able to change the pts bedding and make sure everything was clean and dry. Pt remains confused and restless at this time. This tech continues to monitor and ensure pts safety.

## 2017-09-26 NOTE — ED Provider Notes (Addendum)
----------------------------------------- 3:08 PM on 09/26/2017 -----------------------------------------   Assuming care from Dr. Sharma Covert.  In short, Ashley Choi is a 27 y.o. female with a chief complaint of polysubstance abuse.  Refer to the original H&P for additional details.  The current plan of care is to monitor and keep safe until clinically sober.    ----------------------------------------- 5:47 PM on 09/26/2017 -----------------------------------------  Patient is awake but again not alert, acting out, trying to get out of bed, unsteady, urinating on herself, pulling at equipment.  Still obviously not sober and does not have the capacity to make decisions.  Providing Haldol 3 mg IV as a calming agent to keep the patient safe.    ----------------------------------------- 6:19 PM on 09/26/2017 -----------------------------------------  Even after getting the Haldol, the patient got increasingly more agitated and violent.  I went in the room when I heard yelling and an ED nurse and two techs were trying to hold the patient down to keep her safe that she thrashed around with all 4 of her extremities, yelled incoherently, and was completely un-redirectable.  We tried to physically hold her down to keep her safe and keep our staff safe while administering Ativan 2 mg IV.  She continued to escalate even after the abdomen administration.  This was quickly becoming an increasingly emergent situation so I authorized the use of ketamine 80 mg IV (roughly 1 mg/kg) for the patient's safety as an emergent calming agent for acute behavioral abnormalities.  Given the patient's polysubstance abuse, normal head CT and normal medical work-up, I think this continues to be related to intoxication with multiple substances, likely some of which are unknown.  She has received 3 L of normal saline and has urinated extensively.  I will ask the nurses to draw a new sample of blood to check a CK given how  much she has been fighting and resisting and using her muscle mass.  She may require additional sedation but it is my hope that with 2 mg of IV Ativan, she will smoothly come out of the ketamine rather than coming out violently.  We also lowered the lights and are trying to keep the room calm.   ----------------------------------------- 6:33 PM on 09/26/2017 -----------------------------------------  The patient is already starting to wake up and is starting to thrash around again.  Before she gets too agitated and violent again, I am administering Geodon 20 mg intramuscular.  It is worthwhile to note that the patient is having no rigidity, no clonus, has not been febrile, and continues to have normal vital signs except for tachycardia while she is agitated.  There is no evidence that she is having any acute neurological abnormality other than acute polysubstance intoxication.   ----------------------------------------- 8:02 PM on 09/26/2017 -----------------------------------------  Multiple things have happened since last note, but in summary, the patient continues to be wild and combative and a danger to herself and ED staff.  She required multiple sitters and she still is non-redirectable.  She received an additional Geodon 20 mg IM, Versed 2 mg IV, and she is out of control and dangerous.  I am concerned at her consistent agitation that she is causing metabolic abnormalities as well as the obvious physical threat to herself and others.  Additionally, she has reached potentially dangerous doses of medications including antipsychotics, benzodiazepines, and ketamine.  At this point I feel that I have no choice but to intubate her for her safety to protect her airway while we keep her safely sedated so she no longer  presents a danger and she can metabolize whatever is causing her issues and safely be weaned off of her sedation in the ICU.  I intubated her with etomidate 20 mg IV and rocuronium 100  mg IV.  I then started a Precedex drip at 0.5 mg/kg/hr.  Chest x-ray is pending.  I have Artie spoken with the hospitalist who has seen her in person and he will admit to the ICU for further management.  Procedure Name: Intubation Date/Time: 09/26/2017 8:04 PM Performed by: Loleta Rose, MD Pre-anesthesia Checklist: Patient identified, Emergency Drugs available, Suction available and Patient being monitored Induction Type: IV induction and Rapid sequence Laryngoscope Size: Glidescope and 3 Tube size: 7.0 mm Number of attempts: 1 Placement Confirmation: ETT inserted through vocal cords under direct vision,  CO2 detector and Breath sounds checked- equal and bilateral Dental Injury: Teeth and Oropharynx as per pre-operative assessment     .Critical Care Performed by: Loleta Rose, MD Authorized by: Loleta Rose, MD   Critical care provider statement:    Critical care time (minutes):  45   Critical care time was exclusive of:  Separately billable procedures and treating other patients   Critical care was necessary to treat or prevent imminent or life-threatening deterioration of the following conditions:  CNS failure or compromise and toxidrome   Critical care was time spent personally by me on the following activities:  Development of treatment plan with patient or surrogate, discussions with consultants, evaluation of patient's response to treatment, examination of patient, obtaining history from patient or surrogate, ordering and performing treatments and interventions, ordering and review of laboratory studies, ordering and review of radiographic studies, pulse oximetry, re-evaluation of patient's condition and review of old charts    Labs Reviewed  COMPREHENSIVE METABOLIC PANEL - Abnormal; Notable for the following components:      Result Value   Glucose, Bld 115 (*)    Calcium 8.8 (*)    ALT 11 (*)    All other components within normal limits  URINE DRUG SCREEN, QUALITATIVE  ([facility] ONLY) - Abnormal; Notable for the following components:   Amphetamines, Ur Screen POSITIVE (*)    Cocaine Metabolite,Ur [place] POSITIVE (*)    Opiate, Ur Screen POSITIVE (*)    Cannabinoid 50 Ng, Ur [place] POSITIVE (*)    Benzodiazepine, Ur Scrn POSITIVE (*)    All other components within normal limits  ACETAMINOPHEN LEVEL - Abnormal; Notable for the following components:   Acetaminophen (Tylenol), Serum <10 (*)    All other components within normal limits  URINALYSIS, COMPLETE (UACMP) WITH MICROSCOPIC - Abnormal; Notable for the following components:   Color, Urine AMBER (*)    APPearance CLOUDY (*)    Hgb urine dipstick SMALL (*)    Ketones, ur 20 (*)    Protein, ur 100 (*)    All other components within normal limits  BASIC METABOLIC PANEL - Abnormal; Notable for the following components:   Sodium 146 (*)    Potassium 3.3 (*)    Chloride 118 (*)    Glucose, Bld 109 (*)    Calcium 8.5 (*)    All other components within normal limits  BLOOD GAS, ARTERIAL - Abnormal; Notable for the following components:   pO2, Arterial 438 (*)    All other components within normal limits  GLUCOSE, CAPILLARY - Abnormal; Notable for the following components:   Glucose-Capillary 114 (*)    All other components within normal limits  MRSA PCR SCREENING  CBC  ETHANOL  AMMONIA  LACTIC ACID, PLASMA  CK  HIV ANTIBODY (ROUTINE TESTING)  BASIC METABOLIC PANEL  CBC  CBC  CREATININE, SERUM  POC URINE PREG, ED  POCT PREGNANCY, URINE   CK was within normal limits. Repeat BMP was non-emergent.  Final diagnoses:  Polysubstance abuse (HCC)  Altered mental status, unspecified altered mental status type  Hypotension, unspecified hypotension type  Acute encephalopathy  Acute respiratory failure, unspecified whether with hypoxia or hypercapnia (HCC)      Loleta Rose, MD 09/26/17 2303

## 2017-09-26 NOTE — ED Notes (Signed)
Pt went to CT with Felicia sitter at bedside

## 2017-09-26 NOTE — ED Triage Notes (Signed)
PT arrived via ems with altered mental status. PT unable to communicate at this time. PT was picked up with another female who admits to heroin use. Pt pupils dilated and fixed, breathing unlabored.

## 2017-09-26 NOTE — H&P (Signed)
[facility] [place] [facility]   PATIENT NAME: Ashley Choi    MR#:  [id number]  DATE OF BIRTH:  1990/08/09  DATE OF ADMISSION:  09/26/2017  PRIMARY CARE PHYSICIAN: [facility]   REQUESTING/REFERRING PHYSICIAN: Dr. Loleta Rose  CHIEF COMPLAINT:   Chief Complaint  Patient presents with  . Altered Mental Status    HISTORY OF PRESENT ILLNESS:  Ashley Choi  is a 27 y.o. female with a known history of polysubstance abuse who presents to the hospital due to altered mental status/agitation.  Patient presented to the hospital and was noted to have urine drug screen positive for benzos, cocaine, marijuana, amphetamines, opiates.  She received multiple doses of agitation meds including Haldol, Geodon, Versed, ketamine, Ativan but despite that she continued to be quite agitated.  She was intubated for airway protection due to persistent agitation.  Hospitalist services were contacted for treatment evaluation.  Patient cannot provide a history as she is currently intubated and sedated.  PAST MEDICAL HISTORY:  History reviewed. No pertinent past medical history.  PAST SURGICAL HISTORY:  History reviewed. No pertinent surgical history.  SOCIAL HISTORY:   Social History   Tobacco Use  . Smoking status: Current Every Day Smoker    Packs/day: 0.50    Types: Cigarettes  . Smokeless tobacco: Never Used  Substance Use Topics  . Alcohol use: No    Frequency: Never    FAMILY HISTORY:  No family history on file.  DRUG ALLERGIES:  No Known Allergies  REVIEW OF SYSTEMS:   Review of Systems  Unable to perform ROS: Intubated    MEDICATIONS AT HOME:   Prior to Admission medications   Not on File      VITAL SIGNS:  Blood pressure (!) 149/90, pulse (!) 115, temperature 97.7 F (36.5 C), temperature source Axillary, resp. rate (!) 25, height  (1.676 m), weight 77.1 kg (170 lb), SpO2 100 %.  PHYSICAL EXAMINATION:  Physical  Exam  GENERAL:  27 y.o.-year-old patient lying in the bed intubated and sedated. EYES: Pupils equal, round, reactive to light. No scleral icterus.  HEENT: Head atraumatic, normocephalic.  ETT/OG tubes in place.   NECK:  Supple, no jugular venous distention. No thyroid enlargement, no tenderness.  LUNGS: Normal breath sounds bilaterally, no wheezing, rales, rhonchi. No use of accessory muscles of respiration.  CARDIOVASCULAR: S1, S2 RRR. No murmurs, rubs, gallops, clicks.  ABDOMEN: Soft, nontender, nondistended. Bowel sounds present. No organomegaly or mass.  EXTREMITIES: No pedal edema, cyanosis, or clubbing. + 2 pedal & radial pulses b/l.   NEUROLOGIC: Sedated and intubated. PSYCHIATRIC: Sedated and intubated.Marland Kitchen  SKIN: No obvious rash, lesion, or ulcer.   LABORATORY PANEL:   CBC Recent Labs  Lab 09/26/17 1110  WBC 8.0  HGB 13.1  HCT 38.0  PLT 399   ------------------------------------------------------------------------------------------------------------------  Chemistries  Recent Labs  Lab 09/26/17 1110  NA 139  K 4.0  CL 109  CO2 25  GLUCOSE 115*  BUN 8  CREATININE 0.60  CALCIUM 8.8*  AST 19  ALT 11*  ALKPHOS 75  BILITOT 0.6   ------------------------------------------------------------------------------------------------------------------  Cardiac Enzymes No results for input(s): TROPONINI in the last 168 hours. ------------------------------------------------------------------------------------------------------------------  RADIOLOGY:  Ct Head Wo Contrast  Result Date: 09/26/2017 CLINICAL DATA:  Altered mental status. EXAM: CT HEAD WITHOUT CONTRAST TECHNIQUE: Contiguous axial images were obtained from the base of the skull through the vertex without intravenous contrast. COMPARISON:  CT scan of January 05, 2010. FINDINGS:  Brain: No evidence of acute infarction, hemorrhage, hydrocephalus, extra-axial collection or mass lesion/mass effect. Vascular: No  hyperdense vessel or unexpected calcification. Skull: Normal. Negative for fracture or focal lesion. Sinuses/Orbits: Bilateral maxillary sinusitis is noted. Other: None. IMPRESSION: Mild bilateral maxillary sinusitis. No acute intracranial abnormality seen. Electronically Signed   By: Lupita Raider, M.D.   On: 09/26/2017 12:11   Dg Chest Portable 1 View  Result Date: 09/26/2017 CLINICAL DATA:  Altered mental status EXAM: PORTABLE CHEST 1 VIEW COMPARISON:  02/22/2014 FINDINGS: Motion degraded image of the chest.  No large consolidation. IMPRESSION: Severely motion degraded image of the chest without visible consolidation. Electronically Signed   By: Deatra Robinson M.D.   On: 09/26/2017 13:36     IMPRESSION AND PLAN:   27 year old female with no significant past medical history who presented to the hospital due to altered mental status.  1.  Altered mental status-this is metabolic encephalopathy secondary to polysubstance abuse.  Patient's urine drug screen was positive for benzos, marijuana, opiates, cocaine and amphetamines. - Patient received multiple medications for altered mental status/agitation including Geodon, Haldol, Ativan, Versed, ketamine without much improvement in her symptoms.  Currently patient is intubated and sedated. -Continue Precedex drip, follow mental status once patient is extubated.  2.  Acute respiratory failure-patient was intubated for airway protection. -Patient is being admitted to the intensive care unit.  Wean off the ventilator as per pulmonary/intensivist.  3.  Polysubstance abuse-watch for withdrawal symptoms.     All the records are reviewed and case discussed with ED provider. Management plans discussed with the patient, family and they are in agreement.  CODE STATUS: Full code  TOTAL Critical Care TIME TAKING CARE OF THIS PATIENT: 40 minutes.    Houston Siren M.D on 09/26/2017 at 8:16 PM  Between 7am to 6pm - Pager - [phone]  After 6pm  go to [email or web address] - password [email or web address] [email or web address]  [facility]  Office  [phone]  CC: Primary care physician; [facility]

## 2017-09-26 NOTE — ED Notes (Signed)
Pt given medications at this time. Pt resting comfortably at this time. Sitter Dorian at bedside.

## 2017-09-26 NOTE — ED Notes (Signed)
Key [id number] removed from Pyxis and sent upstairs with pt to ICU 2. RN Tess informed of pt's belonging secured with security box

## 2017-09-26 NOTE — ED Notes (Addendum)
Pt intubated at this time by MD York Cerise 23 at lip 7

## 2017-09-26 NOTE — ED Notes (Addendum)
Pt becoming very agitated and flopping around. Pt throwing legs in air and yelling. Unable to understand what she is saying. Pt is somewhat following commands. Dorian sitter EDT and Mardelle Matte at bedside

## 2017-09-26 NOTE — Consult Note (Addendum)
Name: Ashley Choi MRN: [id number] DOB: [birth year]    ADMISSION DATE:  09/26/2017 CONSULTATION DATE: 09/26/2017  REFERRING MD : Dr. Verdell Carmine   CHIEF COMPLAINT: Altered Mental Status   BRIEF PATIENT DESCRIPTION:  27 yo female admitted with acute encephalopathy secondary to drug overdose requiring mechanical intubation for airway protection   SIGNIFICANT EVENTS/STUDIES:  05/22 Pt admitted to ICU mechanically intubated  05/22 CT Head revealed mild bilateral maxillary sinusitis. No acute intracranial abnormality seen  HISTORY OF PRESENT ILLNESS:   This is a 27 yo female no PMH on file who presented to [facility] ER on 05/22 via EMS due to unresponsiveness.  Per ER notes according to EMS pt was found on the side of the road intermittently combative and unresponsive.  In the ER the pt was tachycardic, and nonverbal with intermittent flailing.  Urine drug screen positive for amphetamines, benzodiazepine, cocaine, cannabinoid, and opiates.  Lab results revealed blood alcohol level <10, tylenol level <10, and UA negative.  Pt became increasingly combative and agitated in the ER requiring antianxiety medications, however she remained combative. She received multiple doses of antipsychotics, benzodiazepines, and ketamine, therefore she required mechanical intubation for airway protection.  She was subsequently admitted to ICU by hospitalist team for further workup and treatment PCCM consulted for vent management.    PAST MEDICAL HISTORY :   has no past medical history on file.  has no past surgical history on file. Prior to Admission medications   Not on File   No Known Allergies  FAMILY HISTORY:  family history is not on file. SOCIAL HISTORY:  reports that she has been smoking cigarettes.  She has been smoking about 0.50 packs per day. She has never used smokeless tobacco. She reports that she has current or past drug history. She reports that she does not drink alcohol.  REVIEW OF SYSTEMS:     Unable to assess pt intubated  SUBJECTIVE:  Unable to assess pt intubated   VITAL SIGNS: Temp:  [97.6 F (36.4 C)-97.7 F (36.5 C)] 97.7 F (36.5 C) (05/22 1443) Pulse Rate:  [89-133] 131 (05/22 1958) Resp:  [14-60] 21 (05/22 1958) BP: (85-137)/(49-85) 114/51 (05/22 1815) SpO2:  [93 %-100 %] 100 % (05/22 1958) Weight:  [77.1 kg (170 lb)] 77.1 kg (170 lb) (05/22 1106)  PHYSICAL EXAMINATION: General: acutely ill appearing female, NAD mechanically intubated  Neuro: sedated, not following commands, PERRL HEENT: supple, no JVD Cardiovascular: sinus tach, no R/G Lungs: rhonchi throughout, even, non labored  Abdomen: +BS x4, soft, non distended  Musculoskeletal: normal bulk and tone, no edema  Skin: intact no rashes or pressure ulcers   Recent Labs  Lab 09/26/17 1110  NA 139  K 4.0  CL 109  CO2 25  BUN 8  CREATININE 0.60  GLUCOSE 115*   Recent Labs  Lab 09/26/17 1110  HGB 13.1  HCT 38.0  WBC 8.0  PLT 399   Ct Head Wo Contrast  Result Date: 09/26/2017 CLINICAL DATA:  Altered mental status. EXAM: CT HEAD WITHOUT CONTRAST TECHNIQUE: Contiguous axial images were obtained from the base of the skull through the vertex without intravenous contrast. COMPARISON:  CT scan of January 05, 2010. FINDINGS: Brain: No evidence of acute infarction, hemorrhage, hydrocephalus, extra-axial collection or mass lesion/mass effect. Vascular: No hyperdense vessel or unexpected calcification. Skull: Normal. Negative for fracture or focal lesion. Sinuses/Orbits: Bilateral maxillary sinusitis is noted. Other: None. IMPRESSION: Mild bilateral maxillary sinusitis. No acute intracranial abnormality seen. Electronically Signed   By: Jeneen Rinks  Murlean Caller, M.D.   On: 09/26/2017 12:11   Dg Chest Portable 1 View  Result Date: 09/26/2017 CLINICAL DATA:  Altered mental status EXAM: PORTABLE CHEST 1 VIEW COMPARISON:  02/22/2014 FINDINGS: Motion degraded image of the chest.  No large consolidation. IMPRESSION:  Severely motion degraded image of the chest without visible consolidation. Electronically Signed   By: Ulyses Jarred M.D.   On: 09/26/2017 13:36    ASSESSMENT / PLAN: Acute encephalopathy secondary to polysubstance use (amphetamines, benzodiazepine, cocaine, cannabinoid, and opiates) Mechanical Intubation for airway protection Hypotension likely secondary to sedating medication  P: Full vent support-vent settings reviewed and established  SBT once all parameters met VAP bundle implemented  Continuous telemetry monitoring Maintain map >65 with fluid resuscitation if bp does not improve will start neo-synephrine  Will check CK Trend BMP  Replace electrolytes as indicated  Monitor UOP SUP px: iv pepcid Keep NPO for now if remains intubated over the next 24hrs will order OG tube and start TF's  Trend WBC and monitor fever curve Will start unasyn for potential aspiration  VTE px: subq lovenox  Trend CBC  Monitor for s/sx of bleeding and transfuse for hgb <7 Maintain RASS goal 0 to -1 Propofol gtt and prn fentanyl to maintain RASS goal  WUA when parameters met  Once pt extubated will need psychiatry consult and polysubstance cessation counseling    Marda Stalker, [place] Pager [phone] (please enter 7 digits) PCCM Consult Pager [phone] (please enter 7 digits)

## 2017-09-26 NOTE — ED Notes (Signed)
Pt very aggressive with staff at this time. MD at bedside. RN Charge Aggie Cosier, Norina Buzzard, ODS and [facility] tech to try and calm down patient for administration of medications.

## 2017-09-26 NOTE — ED Notes (Signed)
Pt urinated herself while in the bed. This tech and Jeannett Senior, RN changed sheets and made sure pt was clean and dry. Pt remains combative and restless at this time but this tech continues to monitor and ensure pts safety.

## 2017-09-26 NOTE — Discharge Instructions (Signed)
Return to the emergency department if you develop severe pain, changes in mental status, inability to keep down fluids, fever, or any other symptoms concerning to you.

## 2017-09-26 NOTE — ED Notes (Signed)
20 etomidate by RN Raquel

## 2017-09-26 NOTE — ED Notes (Addendum)
100 roc given by RN Raquel

## 2017-09-26 NOTE — ED Notes (Addendum)
Pt urinated all over herself multiple times. Pt becoming agitated and swinging arms and legs in air and over bed rail. Pt cleaned up and repositioned in bed.

## 2017-09-26 NOTE — ED Notes (Signed)
Pt know calmed down and sitter Lafonda Mosses at bedside. Lights dimmed. Respirations even and unlabored.

## 2017-09-26 NOTE — ED Notes (Signed)
Ashley Choi at bedside with patient

## 2017-09-27 DIAGNOSIS — J9600 Acute respiratory failure, unspecified whether with hypoxia or hypercapnia: Secondary | ICD-10-CM

## 2017-09-27 DIAGNOSIS — G9340 Encephalopathy, unspecified: Secondary | ICD-10-CM

## 2017-09-27 LAB — BASIC METABOLIC PANEL
Anion gap: 5 (ref 5–15)
BUN: 6 mg/dL (ref 6–20)
CO2: 24 mmol/L (ref 22–32)
Calcium: 8.2 mg/dL — ABNORMAL LOW (ref 8.9–10.3)
Chloride: 119 mmol/L — ABNORMAL HIGH (ref 101–111)
Creatinine, Ser: 0.53 mg/dL (ref 0.44–1.00)
GFR calc Af Amer: >60 mL/min (ref >60)
GFR calc non Af Amer: >60 mL/min (ref >60)
Glucose, Bld: 120 mg/dL — ABNORMAL HIGH (ref 65–99)
Potassium: 3.5 mmol/L (ref 3.5–5.1)
Sodium: 148 mmol/L — ABNORMAL HIGH (ref 135–145)

## 2017-09-27 LAB — CBC
HCT: 34.4 % — ABNORMAL LOW (ref 35.0–47.0)
Hemoglobin: 11.8 g/dL — ABNORMAL LOW (ref 12.0–16.0)
MCH: 29.5 pg (ref 26.0–34.0)
MCHC: 34.2 g/dL (ref 32.0–36.0)
MCV: 86.4 fL (ref 80.0–100.0)
Platelets: 356 10*3/uL (ref 150–440)
RBC: 3.98 MIL/uL (ref 3.80–5.20)
RDW: 14.4 % (ref 11.5–14.5)
WBC: 10.4 10*3/uL (ref 3.6–11.0)

## 2017-09-27 LAB — TRIGLYCERIDES: TRIGLYCERIDES: 168 mg/dL — AB (ref <150)

## 2017-09-27 MED ORDER — LACTATED RINGERS IV SOLN
INTRAVENOUS | Status: DC
  Administered 2017-09-27: 06:00:00 via INTRAVENOUS

## 2017-09-27 MED ORDER — FOLIC ACID 1 MG PO TABS
1.0000 mg | ORAL_TABLET | Freq: Every day | ORAL | Status: DC
  Filled 2017-09-27: qty 1

## 2017-09-27 MED ORDER — PROPOFOL 1000 MG/100ML IV EMUL
5.0000 ug/kg/min | INTRAVENOUS | Status: DC
  Administered 2017-09-27: 50 ug/kg/min via INTRAVENOUS
  Administered 2017-09-27: 10 ug/kg/min via INTRAVENOUS
  Filled 2017-09-27 (×2): qty 100

## 2017-09-27 MED ORDER — TAB-A-VITE/IRON PO TABS
1.0000 | ORAL_TABLET | Freq: Every day | ORAL | Status: DC
  Filled 2017-09-27: qty 1

## 2017-09-27 MED ORDER — VITAMIN B-1 100 MG PO TABS
100.0000 mg | ORAL_TABLET | Freq: Every day | ORAL | Status: DC
  Filled 2017-09-27: qty 1

## 2017-09-27 MED ORDER — ORAL CARE MOUTH RINSE
15.0000 mL | OROMUCOSAL | Status: DC
  Administered 2017-09-27 (×2): 15 mL via OROMUCOSAL

## 2017-09-27 MED ORDER — CHLORHEXIDINE GLUCONATE 0.12% ORAL RINSE (MEDLINE KIT)
15.0000 mL | Freq: Two times a day (BID) | OROMUCOSAL | Status: DC
  Administered 2017-09-27 (×2): 15 mL via OROMUCOSAL

## 2017-09-27 NOTE — Progress Notes (Signed)
[facility] - [place] at [facility]   PATIENT NAME: Ashley Choi    MR#:  [id number]  DATE OF BIRTH:  1991-03-04  SUBJECTIVE:  CHIEF COMPLAINT:   Chief Complaint  Patient presents with  . Altered Mental Status   Brought with altered mental status secondary to multiple drug abuse.   Was extubated today morning when I saw the patient but still was drowsy.  REVIEW OF SYSTEMS:  CONSTITUTIONAL: No fever, fatigue or weakness.  EYES: No blurred or double vision.  EARS, NOSE, AND THROAT: No tinnitus or ear pain.  RESPIRATORY: No cough, shortness of breath, wheezing or hemoptysis.  CARDIOVASCULAR: No chest pain, orthopnea, edema.  GASTROINTESTINAL: No nausea, vomiting, diarrhea or abdominal pain.  GENITOURINARY: No dysuria, hematuria.  ENDOCRINE: No polyuria, nocturia,  HEMATOLOGY: No anemia, easy bruising or bleeding SKIN: No rash or lesion. MUSCULOSKELETAL: No joint pain or arthritis.   NEUROLOGIC: No tingling, numbness, weakness.  PSYCHIATRY: No anxiety or depression.   ROS  DRUG ALLERGIES:  No Known Allergies  VITALS:  Blood pressure 112/63, pulse (!) 58, temperature 98.2 F (36.8 C), temperature source Oral, resp. rate (!) 36, height  (1.575 m), weight 71.2 kg (156 lb 15.5 oz), SpO2 97 %.  PHYSICAL EXAMINATION:  GENERAL:  27 y.o.-year-old patient lying in the bed with no acute distress.  EYES: Pupils equal, round, reactive to light and accommodation. No scleral icterus. Extraocular muscles intact.  HEENT: Head atraumatic, normocephalic. Oropharynx and nasopharynx clear.  NECK:  Supple, no jugular venous distention. No thyroid enlargement, no tenderness.  LUNGS: Normal breath sounds bilaterally, no wheezing, rales,rhonchi or crepitation. No use of accessory muscles of respiration.  CARDIOVASCULAR: S1, S2 normal. No murmurs, rubs, or gallops.  ABDOMEN: Soft, nontender, nondistended. Bowel sounds present. No organomegaly or mass.  EXTREMITIES: No pedal  edema, cyanosis, or clubbing.  NEUROLOGIC: Cranial nerves II through XII are intact. Muscle strength 4-5/5 in all extremities. Sensation intact. Gait not checked.  PSYCHIATRIC: The patient is drowsy but easily arousable.  SKIN: No obvious rash, lesion, or ulcer.   Physical Exam LABORATORY PANEL:   CBC Recent Labs  Lab 09/27/17 0214  WBC 10.4  HGB 11.8*  HCT 34.4*  PLT 356   ------------------------------------------------------------------------------------------------------------------  Chemistries  Recent Labs  Lab 09/26/17 1110  09/27/17 0214  NA 139   < > 148*  K 4.0   < > 3.5  CL 109   < > 119*  CO2 25   < > 24  GLUCOSE 115*   < > 120*  BUN 8   < > 6  CREATININE 0.60   < > 0.53  CALCIUM 8.8*   < > 8.2*  AST 19  --   --   ALT 11*  --   --   ALKPHOS 75  --   --   BILITOT 0.6  --   --    < > = values in this interval not displayed.   ------------------------------------------------------------------------------------------------------------------  Cardiac Enzymes No results for input(s): TROPONINI in the last 168 hours. ------------------------------------------------------------------------------------------------------------------  RADIOLOGY:  Ct Head Wo Contrast  Result Date: 09/26/2017 CLINICAL DATA:  Altered mental status. EXAM: CT HEAD WITHOUT CONTRAST TECHNIQUE: Contiguous axial images were obtained from the base of the skull through the vertex without intravenous contrast. COMPARISON:  CT scan of January 05, 2010. FINDINGS: Brain: No evidence of acute infarction, hemorrhage, hydrocephalus, extra-axial collection or mass lesion/mass effect. Vascular: No hyperdense vessel or unexpected calcification. Skull: Normal. Negative for fracture or  focal lesion. Sinuses/Orbits: Bilateral maxillary sinusitis is noted. Other: None. IMPRESSION: Mild bilateral maxillary sinusitis. No acute intracranial abnormality seen. Electronically Signed   By: Lupita Raider, M.D.   On:  09/26/2017 12:11   Dg Chest Portable 1 View  Result Date: 09/26/2017 CLINICAL DATA:  Post intubation. EXAM: PORTABLE CHEST 1 VIEW COMPARISON:  09/26/2017 FINDINGS: Endotracheal tube terminates within the proximal right mainstem bronchus. Retraction with approximately 3-4 cm is recommended. Cardiomediastinal silhouette is normal. Mediastinal contours appear intact. There is no evidence of focal airspace consolidation, pleural effusion or pneumothorax. Osseous structures are without acute abnormality. Soft tissues are grossly normal. IMPRESSION: Endotracheal tube terminates within the proximal right mainstem bronchus. Retraction with approximately 3-4 cm is recommended. These results were called by telephone at the time of interpretation on 09/26/2017 at 9:50 pm to Dr. Loleta Rose, who verbally acknowledged these results. Electronically Signed   By: Ted Mcalpine M.D.   On: 09/26/2017 21:52   Dg Chest Portable 1 View  Result Date: 09/26/2017 CLINICAL DATA:  Altered mental status EXAM: PORTABLE CHEST 1 VIEW COMPARISON:  02/22/2014 FINDINGS: Motion degraded image of the chest.  No large consolidation. IMPRESSION: Severely motion degraded image of the chest without visible consolidation. Electronically Signed   By: Deatra Robinson M.D.   On: 09/26/2017 13:36    ASSESSMENT AND PLAN:   Active Problems:   Altered mental status   Acute encephalopathy   Acute respiratory failure (HCC)   27 year old female with no significant past medical history who presented to the hospital due to altered mental status.  1.  Altered mental status-this is metabolic encephalopathy secondary to polysubstance abuse.  Patient's urine drug screen was positive for benzos, marijuana, opiates, cocaine and amphetamines. - Patient received multiple medications for altered mental status/agitation including Geodon, Haldol, Ativan, Versed, ketamine without much improvement in her symptoms.    intubated and sedated. -Continue  Precedex drip, appreciated helped by intensivist. - Extubated, monitor for now and may transfuse to floor.  2.  Acute respiratory failure-patient was intubated for airway protection. -Patient is being admitted to the intensive care unit.  Wean off the ventilator as per pulmonary/intensivist.  3.  Polysubstance abuse-watch for withdrawal symptoms.   All the records are reviewed and case discussed with Care Management/Social Workerr. Management plans discussed with the patient, family and they are in agreement.  CODE STATUS: full.  TOTAL TIME TAKING CARE OF THIS PATIENT: 35 minutes.     POSSIBLE D/C IN 1-2 DAYS, DEPENDING ON CLINICAL CONDITION.   Altamese Dilling M.D on 09/27/2017   Between 7am to 6pm - Pager - [phone]  After 6pm go to [email or web address] - Social research officer, government  [facility]  Office  [phone]  CC: Primary care physician; [facility]  Note: This dictation was prepared with Nurse, children's dictation along with smaller phrase technology. Any transcriptional errors that result from this process are unintentional.

## 2017-09-27 NOTE — Progress Notes (Signed)
Dr Belia Heman in room around 0800  while patient is agitated and trying to pull at ET tube, MD states "lets go ahead and extubate her"  Patient extubated with no difficulty.  Patient currently on room air with no distress however is still very sleep, responds by nodding and shaking head but resist many attempts to communicate.  Patient able to take sips of water but refused food.  RN continue to monitor.

## 2017-09-27 NOTE — Progress Notes (Signed)
Boyfriend at bedside and planning to stay during the night. Telesitter placed for monitoring of possible substance activity. Bo Mcclintock, RN

## 2017-09-27 NOTE — Progress Notes (Signed)
Asked patient specifically if she wanted [place] to have her belongings locked up with security, and she stated that she wanted Selena Batten (her boyfriend) to take them.  Cash was sent to security and the key is in her chart.  Her boyfriend's sister is with him, but will be leaving him here to stay with her.  Telesitter in place.

## 2017-09-27 NOTE — Progress Notes (Signed)
Patient stable, received room assignment 104, Patient and sister, Efraim Kaufmann updated.  Report called to receiving RN, Lance Bosch with no further questions.

## 2017-09-27 NOTE — Progress Notes (Signed)
Vital signs stable, patient more alert but still very sleepy, does not want to talk.  RN clarified with Dr Belia Heman, MD gave order for patient to be floor care with no need for telemetry monitoring.

## 2017-09-28 ENCOUNTER — Other Ambulatory Visit: Payer: Self-pay

## 2017-09-28 LAB — HIV ANTIBODY (ROUTINE TESTING W REFLEX): HIV Screen 4th Generation wRfx: NONREACTIVE

## 2017-09-28 NOTE — Progress Notes (Signed)
Discharge teaching given to patient, patient verbalized understanding and had no questions Patient IV removed yesterday 5/23 per pt. report. Patient will be transported home by taxi cab per her report. All patient belongings gathered prior to leaving. Patient educated on seeking treatment for substance abuse and stated "i'm not going to that".

## 2017-09-28 NOTE — Clinical Social Work Note (Signed)
CSW was informed that patient had history of polysubstance abuse, CSW provided resources for substance abuse treatment.  CSW informed her that she would have to call the different facilities if she felt like she is ready for treatment.  Patient asked about her money and belongings, CSW informed her that security will return her belongings to her.  CSW signing off, please reconsult if other social work needs arise.  Ashley Choi. Ashley Choi, MSW, Theresia Majors [phone]  09/28/2017 9:41 AM

## 2017-10-04 NOTE — Discharge Summary (Signed)
[facility] - [facility] - [place] at [facility]   PATIENT NAME: Ashley Choi    MR#:  [id number]  DATE OF BIRTH:  April 07, 1991  DATE OF ADMISSION:  09/26/2017 ADMITTING PHYSICIAN: Houston Siren, MD  DATE OF DISCHARGE: 09/28/2017  9:50 AM  PRIMARY CARE PHYSICIAN: [facility]    ADMISSION DIAGNOSIS:  Polysubstance abuse (HCC) [F19.10] Acute encephalopathy [G93.40] Acute respiratory failure, unspecified whether with hypoxia or hypercapnia (HCC) [J96.00] Hypotension, unspecified hypotension type [I95.9] Altered mental status, unspecified altered mental status type [R41.82]  DISCHARGE DIAGNOSIS:  Active Problems:   Altered mental status   Acute encephalopathy   Acute respiratory failure (HCC)   SECONDARY DIAGNOSIS:  History reviewed. No pertinent past medical history.  HOSPITAL COURSE:   27 year old female with no significant past medical history who presented to the hospital due to altered mental status.  1. Altered mental status-this is metabolic encephalopathy secondary to polysubstance abuse. Patient's urine drug screen was positive for benzos, marijuana, opiates, cocaine and amphetamines. - Patient received multiple medications for altered mental status/agitation including Geodon, Haldol, Ativan, Versed, ketamine without much improvement in her symptoms.   intubated and sedated. -Continue Precedex drip, appreciated helped by intensivist. - Extubated, monitor for now and may transfuse to floor.  2. Acute respiratory failure-patient was intubated for airway protection. -Patient is being admitted to the intensive care unit. Wean off the ventilator as per pulmonary/intensivist.  3. Polysubstance abuse-watch for withdrawal symptoms.     DISCHARGE CONDITIONS:   Stable.  CONSULTS OBTAINED:    DRUG ALLERGIES:  No Known Allergies  DISCHARGE MEDICATIONS:   Allergies as of 09/28/2017   No Known Allergies      Medication List    You have not been prescribed any medications.      DISCHARGE INSTRUCTIONS:    Follow with PMD in 1-2 weeks.  If you experience worsening of your admission symptoms, develop shortness of breath, life threatening emergency, suicidal or homicidal thoughts you must seek medical attention immediately by calling 911 or calling your MD immediately  if symptoms less severe.  You Must read complete instructions/literature along with all the possible adverse reactions/side effects for all the Medicines you take and that have been prescribed to you. Take any new Medicines after you have completely understood and accept all the possible adverse reactions/side effects.   Please note  You were cared for by a hospitalist during your hospital stay. If you have any questions about your discharge medications or the care you received while you were in the hospital after you are discharged, you can call the unit and asked to speak with the hospitalist on call if the hospitalist that took care of you is not available. Once you are discharged, your primary care physician will handle any further medical issues. Please note that NO REFILLS for any discharge medications will be authorized once you are discharged, as it is imperative that you return to your primary care physician (or establish a relationship with a primary care physician if you do not have one) for your aftercare needs so that they can reassess your need for medications and monitor your lab values.    Today   CHIEF COMPLAINT:   Chief Complaint  Patient presents with  . Altered Mental Status    HISTORY OF PRESENT ILLNESS:  Ashley Choi  is a 27 y.o. female with a known history of  polysubstance abuse who presents to the hospital due to altered mental status/agitation.  Patient presented  to the hospital and was noted to have urine drug screen positive for benzos, cocaine, marijuana, amphetamines, opiates.  She received  multiple doses of agitation meds including Haldol, Geodon, Versed, ketamine, Ativan but despite that she continued to be quite agitated.  She was intubated for airway protection due to persistent agitation.  Hospitalist services were contacted for treatment evaluation.  Patient cannot provide a history as she is currently intubated and sedated.     VITAL SIGNS:  Blood pressure 134/78, pulse (!) 42, temperature 99.4 F (37.4 C), temperature source Oral, resp. rate 14, height  (1.575 m), weight 71.2 kg (156 lb 15.5 oz), SpO2 99 %.  I/O:  No intake or output data in the 24 hours ending 10/04/17 2346  PHYSICAL EXAMINATION:  GENERAL:  27 y.o.-year-old patient lying in the bed with no acute distress.  EYES: Pupils equal, round, reactive to light and accommodation. No scleral icterus. Extraocular muscles intact.  HEENT: Head atraumatic, normocephalic. Oropharynx and nasopharynx clear.  NECK:  Supple, no jugular venous distention. No thyroid enlargement, no tenderness.  LUNGS: Normal breath sounds bilaterally, no wheezing, rales,rhonchi or crepitation. No use of accessory muscles of respiration.  CARDIOVASCULAR: S1, S2 normal. No murmurs, rubs, or gallops.  ABDOMEN: Soft, non-tender, non-distended. Bowel sounds present. No organomegaly or mass.  EXTREMITIES: No pedal edema, cyanosis, or clubbing.  NEUROLOGIC: Cranial nerves II through XII are intact. Muscle strength 5/5 in all extremities. Sensation intact. Gait not checked.  PSYCHIATRIC: The patient is alert and oriented x 3.  SKIN: No obvious rash, lesion, or ulcer.   DATA REVIEW:   CBC No results for input(s): WBC, HGB, HCT, PLT in the last 168 hours.  Chemistries  No results for input(s): NA, K, CL, CO2, GLUCOSE, BUN, CREATININE, CALCIUM, MG, AST, ALT, ALKPHOS, BILITOT in the last 168 hours.  Invalid input(s): GFRCGP  Cardiac Enzymes No results for input(s): TROPONINI in the last 168 hours.  Microbiology Results  Results for  orders placed or performed during the hospital encounter of 09/26/17  MRSA PCR Screening     Status: None   Collection Time: 09/26/17 10:05 PM  Result Value Ref Range Status   MRSA by PCR NEGATIVE NEGATIVE Final    Comment:        The GeneXpert MRSA Assay (FDA approved for NASAL specimens only), is one component of a comprehensive MRSA colonization surveillance program. It is not intended to diagnose MRSA infection nor to guide or monitor treatment for MRSA infections. Performed at [facility], [address], [place], [place] [address]     RADIOLOGY:  No results found.  EKG:   Orders placed or performed during the hospital encounter of 09/26/17  . ED EKG  . ED EKG  . EKG 12-Lead  . EKG 12-Lead  . EKG      Management plans discussed with the patient, family and they are in agreement.  CODE STATUS:  Code Status History    Date Active Date Inactive Code Status Order ID Comments User Context   09/26/2017 2159 09/28/2017 1258 Full Code [id number]  Houston Siren, MD Inpatient      TOTAL TIME TAKING CARE OF THIS PATIENT: 35 minutes.    Altamese Dilling M.D on 10/04/2017 at 11:46 PM  Between 7am to 6pm - Pager - [phone]  After 6pm go to [email or web address] - Social research officer, government  [facility]  Office  [phone]  CC: Primary care physician; [facility]   Note: This  dictation was prepared with Dragon dictation along with smaller phrase technology. Any transcriptional errors that result from this process are unintentional.

## 2019-10-17 ENCOUNTER — Emergency Department: Payer: Self-pay

## 2019-10-17 ENCOUNTER — Emergency Department
Admission: EM | Admit: 2019-10-17 | Discharge: 2019-10-18 | Disposition: A | Discharge status: Home / Self Care | Payer: Self-pay | Attending: Emergency Medicine | Admitting: Emergency Medicine

## 2019-10-17 ENCOUNTER — Other Ambulatory Visit: Payer: Self-pay

## 2019-10-17 DIAGNOSIS — R05 Cough: Secondary | ICD-10-CM | POA: Insufficient documentation

## 2019-10-17 DIAGNOSIS — R682 Dry mouth, unspecified: Secondary | ICD-10-CM | POA: Insufficient documentation

## 2019-10-17 DIAGNOSIS — Z5321 Procedure and treatment not carried out due to patient leaving prior to being seen by health care provider: Secondary | ICD-10-CM | POA: Insufficient documentation

## 2019-10-17 DIAGNOSIS — R0989 Other specified symptoms and signs involving the circulatory and respiratory systems: Secondary | ICD-10-CM | POA: Insufficient documentation

## 2019-10-17 NOTE — ED Triage Notes (Signed)
PT To ED from home c/o covid exposure a few days ago. PT says since then she has had a productive cough, "chest rattling" and dry mouth. No fevers, no CP.

## 2019-10-18 ENCOUNTER — Encounter (HOSPITAL_COMMUNITY): Payer: Self-pay

## 2019-10-18 ENCOUNTER — Other Ambulatory Visit: Payer: Self-pay

## 2019-10-18 ENCOUNTER — Emergency Department (HOSPITAL_COMMUNITY)
Admission: EM | Admit: 2019-10-18 | Discharge: 2019-10-18 | Disposition: A | Discharge status: Home / Self Care | Payer: HRSA Program | Attending: Emergency Medicine | Admitting: Emergency Medicine

## 2019-10-18 DIAGNOSIS — J069 Acute upper respiratory infection, unspecified: Secondary | ICD-10-CM | POA: Insufficient documentation

## 2019-10-18 DIAGNOSIS — F17210 Nicotine dependence, cigarettes, uncomplicated: Secondary | ICD-10-CM | POA: Insufficient documentation

## 2019-10-18 DIAGNOSIS — R110 Nausea: Secondary | ICD-10-CM | POA: Diagnosis not present

## 2019-10-18 DIAGNOSIS — R109 Unspecified abdominal pain: Secondary | ICD-10-CM | POA: Insufficient documentation

## 2019-10-18 DIAGNOSIS — Z20822 Contact with and (suspected) exposure to covid-19: Secondary | ICD-10-CM | POA: Insufficient documentation

## 2019-10-18 DIAGNOSIS — R05 Cough: Secondary | ICD-10-CM | POA: Diagnosis present

## 2019-10-18 LAB — COMPREHENSIVE METABOLIC PANEL
ALT: 14 U/L (ref 0–44)
AST: 17 U/L (ref 15–41)
Albumin: 4.4 g/dL (ref 3.5–5.0)
Alkaline Phosphatase: 79 U/L (ref 38–126)
Anion gap: 12 (ref 5–15)
BUN: 11 mg/dL (ref 6–20)
CO2: 23 mmol/L (ref 22–32)
Calcium: 9.6 mg/dL (ref 8.9–10.3)
Chloride: 104 mmol/L (ref 98–111)
Creatinine, Ser: 0.64 mg/dL (ref 0.44–1.00)
GFR calc Af Amer: >60 mL/min (ref >60)
GFR calc non Af Amer: >60 mL/min (ref >60)
Glucose, Bld: 124 mg/dL — ABNORMAL HIGH (ref 70–99)
Potassium: 4.3 mmol/L (ref 3.5–5.1)
Sodium: 139 mmol/L (ref 135–145)
Total Bilirubin: 0.6 mg/dL (ref 0.3–1.2)
Total Protein: 8 g/dL (ref 6.5–8.1)

## 2019-10-18 LAB — URINALYSIS, ROUTINE W REFLEX MICROSCOPIC
Bilirubin Urine: NEGATIVE
Glucose, UA: NEGATIVE mg/dL
Ketones, ur: NEGATIVE mg/dL
Nitrite: NEGATIVE
Protein, ur: 30 mg/dL — AB
Specific Gravity, Urine: 1.029 (ref 1.005–1.030)
pH: 5 (ref 5.0–8.0)

## 2019-10-18 LAB — CBC
HCT: 44.2 % (ref 36.0–46.0)
Hemoglobin: 14.6 g/dL (ref 12.0–15.0)
MCH: 28.7 pg (ref 26.0–34.0)
MCHC: 33 g/dL (ref 30.0–36.0)
MCV: 86.8 fL (ref 80.0–100.0)
Platelets: 446 10*3/uL — ABNORMAL HIGH (ref 150–400)
RBC: 5.09 MIL/uL (ref 3.87–5.11)
RDW: 14.3 % (ref 11.5–15.5)
WBC: 10.7 10*3/uL — ABNORMAL HIGH (ref 4.0–10.5)
nRBC: 0 % (ref 0.0–0.2)

## 2019-10-18 LAB — I-STAT BETA HCG BLOOD, ED (MC, WL, AP ONLY): I-stat hCG, quantitative: <5 m[IU]/mL (ref <5)

## 2019-10-18 LAB — LIPASE, BLOOD: Lipase: 25 U/L (ref 11–51)

## 2019-10-18 MED ORDER — SODIUM CHLORIDE 0.9% FLUSH: 3.0000 mL | Freq: Once | INTRAVENOUS | Status: DC

## 2019-10-18 NOTE — ED Notes (Signed)
Pt went outside 

## 2019-10-18 NOTE — ED Notes (Signed)
Labeled urine specimen and culture sent to lab. ENMiles 

## 2019-10-18 NOTE — ED Notes (Signed)
Pt called from lobby, no answer. 

## 2019-10-18 NOTE — ED Triage Notes (Addendum)
Patient c/o productive cough with yellow sputum x a few days ago. Patient states her room mate told her she was + Covid. Patientc/o dry mouth, but states she has taste. Patient was at [facility] ED last night, but wait was long and left. Patient c/o lower abdominal pain x 2 days ago.

## 2019-10-18 NOTE — ED Notes (Signed)
PT called from lobby, no answer

## 2019-10-19 LAB — SARS CORONAVIRUS 2 BY RT PCR (HOSPITAL ORDER, PERFORMED IN ~~LOC~~ HOSPITAL LAB): SARS Coronavirus 2: NEGATIVE

## 2019-10-20 LAB — URINE CULTURE

## 2019-10-20 NOTE — ED Provider Notes (Signed)
[facility]-EMERGENCY DEPT Provider Note   CSN: [id number] Arrival date & time: 10/18/19  1815     History Chief Complaint  Patient presents with  . Cough  . Abdominal Pain    Ashley Choi is a 29 y.o. female.  HPI    29 year old female who is essentially concerned that she may potentially have Covid.  Initially went to [facility] for evaluation but left prior to being seen was the extended wait.  She has been having cough, dry mouth and some body aches for the past several days.  Some crampy intermittent abdominal pain.  Mild nausea.  No vomiting.  No diarrhea.  No urinary complaints.  Reports contact with Covid recently.  History reviewed. No pertinent past medical history.  Patient Active Problem List   Diagnosis Date Noted  . Acute encephalopathy   . Acute respiratory failure (HCC)   . Altered mental status 09/26/2017    History reviewed. No pertinent surgical history.   OB History   No obstetric history on file.     Family History  Family history unknown: Yes    Social History   Tobacco Use  . Smoking status: Current Every Day Smoker    Packs/day: 0.50    Types: Cigarettes  . Smokeless tobacco: Never Used  Vaping Use  . Vaping Use: Never used  Substance Use Topics  . Alcohol use: No  . 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  Review of Systems   Review of Systems All systems reviewed and negative, other than as noted in HPI.  Physical Exam Updated Vital Signs BP (!) 131/96   Pulse 79   Temp 98.1 F (36.7 C) (Oral)   Resp 18   Ht 5\' 1"  (1.549 m)   Wt 77.1 kg   LMP 09/17/2019   SpO2 100%   BMI 32.12 kg/m   Physical Exam Vitals and nursing note reviewed.  Constitutional:      General: She is not in acute distress.    Appearance: She is well-developed.  HENT:     Head: Normocephalic and  atraumatic.  Eyes:     General:        Right eye: No discharge.        Left eye: No discharge.     Conjunctiva/sclera: Conjunctivae normal.  Cardiovascular:     Rate and Rhythm: Normal rate and regular rhythm.     Heart sounds: Normal heart sounds. No murmur heard.  No friction rub. No gallop.   Pulmonary:     Effort: Pulmonary effort is normal. No respiratory distress.     Breath sounds: Normal breath sounds.  Abdominal:     General: There is no distension.     Palpations: Abdomen is soft.     Tenderness: There is no abdominal tenderness.  Musculoskeletal:        General: No tenderness.     Cervical back: Neck supple.  Skin:    General: Skin is warm and dry.  Neurological:     Mental Status: She is alert.  Psychiatric:        Behavior: Behavior normal.        Thought Content: Thought content normal.     ED Results / Procedures / Treatments   Labs (all labs ordered are listed, but only abnormal results are displayed)  Labs Reviewed  URINE CULTURE - Abnormal; Notable for the following components:      Result Value   Culture MULTIPLE SPECIES PRESENT, SUGGEST RECOLLECTION (*)    All other components within normal limits  COMPREHENSIVE METABOLIC PANEL - Abnormal; Notable for the following components:   Glucose, Bld 124 (*)    All other components within normal limits  CBC - Abnormal; Notable for the following components:   WBC 10.7 (*)    Platelets 446 (*)    All other components within normal limits  URINALYSIS, ROUTINE W REFLEX MICROSCOPIC - Abnormal; Notable for the following components:   APPearance HAZY (*)    Hgb urine dipstick SMALL (*)    Protein, ur 30 (*)    Leukocytes,Ua MODERATE (*)    Bacteria, UA RARE (*)    All other components within normal limits  SARS CORONAVIRUS 2 BY RT PCR (HOSPITAL ORDER, [facility])  LIPASE, BLOOD  I-STAT BETA HCG BLOOD, ED ([facility], [facility], [facility] ONLY)    EKG None  Radiology No results  found.  Procedures Procedures (including critical care time)  Medications Ordered in ED Medications - No data to display  ED Course  I have reviewed the triage vital signs and the nursing notes.  Pertinent labs & imaging results that were available during my care of the patient were reviewed by me and considered in my medical decision making (see chart for details).    MDM Rules/Calculators/A&P                           29 year old female with symptoms consistent with a mild viral respiratory illness.  She is concerned about possibly having Covid.  Will test.  Even if so, she appears well and I think she is appropriate for outpatient treatment.  Needs to quarantine until test resulted.  Work note provided.  Symptomatic treatment otherwise.  Ashley Choi was evaluated in Emergency Department on 10/20/2019 for the symptoms described in the history of present illness. She was evaluated in the context of the global COVID-19 pandemic, which necessitated consideration that the patient might be at risk for infection with the SARS-CoV-2 virus that causes COVID-19. Institutional protocols and algorithms that pertain to the evaluation of patients at risk for COVID-19 are in a state of rapid change based on information released by regulatory bodies including the CDC and federal and state organizations. These policies and algorithms were followed during the patient's care in the ED.   Final Clinical Impression(s) / ED Diagnoses Final diagnoses:  Upper respiratory tract infection, unspecified type    Rx / DC Orders ED Discharge Orders    None       Virgel Manifold, MD 10/20/19 1017

## 2019-12-08 ENCOUNTER — Emergency Department (HOSPITAL_COMMUNITY): Payer: Medicaid Other

## 2019-12-08 ENCOUNTER — Inpatient Hospital Stay (HOSPITAL_COMMUNITY): Payer: Medicaid Other

## 2019-12-08 ENCOUNTER — Inpatient Hospital Stay (HOSPITAL_COMMUNITY)
Admission: EM | Admit: 2019-12-08 | Discharge: 2019-12-18 | DRG: 958 | Disposition: A | Discharge status: Home / Self Care | Payer: Medicaid Other | Attending: Student | Admitting: Student

## 2019-12-08 ENCOUNTER — Encounter (HOSPITAL_COMMUNITY): Admission: EM | Disposition: A | Discharge status: Home / Self Care | Payer: Self-pay | Source: Home / Self Care

## 2019-12-08 ENCOUNTER — Inpatient Hospital Stay (HOSPITAL_COMMUNITY): Payer: Medicaid Other | Admitting: Certified Registered"

## 2019-12-08 ENCOUNTER — Encounter (HOSPITAL_COMMUNITY): Payer: Self-pay

## 2019-12-08 DIAGNOSIS — S37039A Laceration of unspecified kidney, unspecified degree, initial encounter: Secondary | ICD-10-CM

## 2019-12-08 DIAGNOSIS — F329 Major depressive disorder, single episode, unspecified: Secondary | ICD-10-CM | POA: Diagnosis present

## 2019-12-08 DIAGNOSIS — S32009A Unspecified fracture of unspecified lumbar vertebra, initial encounter for closed fracture: Secondary | ICD-10-CM | POA: Diagnosis present

## 2019-12-08 DIAGNOSIS — S32811A Multiple fractures of pelvis with unstable disruption of pelvic ring, initial encounter for closed fracture: Secondary | ICD-10-CM | POA: Diagnosis present

## 2019-12-08 DIAGNOSIS — D62 Acute posthemorrhagic anemia: Secondary | ICD-10-CM | POA: Diagnosis present

## 2019-12-08 DIAGNOSIS — R200 Anesthesia of skin: Secondary | ICD-10-CM | POA: Diagnosis present

## 2019-12-08 DIAGNOSIS — F1910 Other psychoactive substance abuse, uncomplicated: Secondary | ICD-10-CM | POA: Diagnosis present

## 2019-12-08 DIAGNOSIS — Y92410 Unspecified street and highway as the place of occurrence of the external cause: Secondary | ICD-10-CM | POA: Diagnosis not present

## 2019-12-08 DIAGNOSIS — T80818A Extravasation of other vesicant agent, initial encounter: Secondary | ICD-10-CM | POA: Diagnosis present

## 2019-12-08 DIAGNOSIS — I959 Hypotension, unspecified: Secondary | ICD-10-CM | POA: Diagnosis present

## 2019-12-08 DIAGNOSIS — Z23 Encounter for immunization: Secondary | ICD-10-CM

## 2019-12-08 DIAGNOSIS — T1490XA Injury, unspecified, initial encounter: Secondary | ICD-10-CM

## 2019-12-08 DIAGNOSIS — R197 Diarrhea, unspecified: Secondary | ICD-10-CM | POA: Diagnosis not present

## 2019-12-08 DIAGNOSIS — S270XXA Traumatic pneumothorax, initial encounter: Secondary | ICD-10-CM | POA: Diagnosis present

## 2019-12-08 DIAGNOSIS — S40021A Contusion of right upper arm, initial encounter: Secondary | ICD-10-CM | POA: Diagnosis present

## 2019-12-08 DIAGNOSIS — J939 Pneumothorax, unspecified: Secondary | ICD-10-CM

## 2019-12-08 DIAGNOSIS — S36031A Moderate laceration of spleen, initial encounter: Secondary | ICD-10-CM | POA: Diagnosis present

## 2019-12-08 DIAGNOSIS — N9489 Other specified conditions associated with female genital organs and menstrual cycle: Secondary | ICD-10-CM | POA: Diagnosis present

## 2019-12-08 DIAGNOSIS — S35402A Unspecified injury of left renal artery, initial encounter: Principal | ICD-10-CM | POA: Diagnosis present

## 2019-12-08 DIAGNOSIS — S3289XA Fracture of other parts of pelvis, initial encounter for closed fracture: Secondary | ICD-10-CM | POA: Diagnosis not present

## 2019-12-08 DIAGNOSIS — S12600A Unspecified displaced fracture of seventh cervical vertebra, initial encounter for closed fracture: Secondary | ICD-10-CM | POA: Diagnosis present

## 2019-12-08 DIAGNOSIS — N3289 Other specified disorders of bladder: Secondary | ICD-10-CM | POA: Diagnosis present

## 2019-12-08 DIAGNOSIS — S36116A Major laceration of liver, initial encounter: Secondary | ICD-10-CM | POA: Diagnosis present

## 2019-12-08 DIAGNOSIS — Z419 Encounter for procedure for purposes other than remedying health state, unspecified: Secondary | ICD-10-CM

## 2019-12-08 DIAGNOSIS — S37032A Laceration of left kidney, unspecified degree, initial encounter: Secondary | ICD-10-CM

## 2019-12-08 DIAGNOSIS — S37009A Unspecified injury of unspecified kidney, initial encounter: Secondary | ICD-10-CM

## 2019-12-08 DIAGNOSIS — F17210 Nicotine dependence, cigarettes, uncomplicated: Secondary | ICD-10-CM | POA: Diagnosis present

## 2019-12-08 DIAGNOSIS — Z20822 Contact with and (suspected) exposure to covid-19: Secondary | ICD-10-CM | POA: Diagnosis present

## 2019-12-08 DIAGNOSIS — K458 Other specified abdominal hernia without obstruction or gangrene: Secondary | ICD-10-CM | POA: Diagnosis present

## 2019-12-08 DIAGNOSIS — R000 Tachycardia, unspecified: Secondary | ICD-10-CM | POA: Diagnosis present

## 2019-12-08 DIAGNOSIS — S36113A Laceration of liver, unspecified degree, initial encounter: Secondary | ICD-10-CM

## 2019-12-08 DIAGNOSIS — M546 Pain in thoracic spine: Secondary | ICD-10-CM | POA: Diagnosis not present

## 2019-12-08 DIAGNOSIS — S022XXA Fracture of nasal bones, initial encounter for closed fracture: Secondary | ICD-10-CM | POA: Diagnosis present

## 2019-12-08 DIAGNOSIS — S329XXA Fracture of unspecified parts of lumbosacral spine and pelvis, initial encounter for closed fracture: Secondary | ICD-10-CM | POA: Diagnosis present

## 2019-12-08 DIAGNOSIS — M79604 Pain in right leg: Secondary | ICD-10-CM | POA: Diagnosis present

## 2019-12-08 DIAGNOSIS — S32129A Unspecified Zone II fracture of sacrum, initial encounter for closed fracture: Secondary | ICD-10-CM | POA: Diagnosis present

## 2019-12-08 DIAGNOSIS — S3729XA Other injury of bladder, initial encounter: Secondary | ICD-10-CM

## 2019-12-08 DIAGNOSIS — F419 Anxiety disorder, unspecified: Secondary | ICD-10-CM | POA: Diagnosis present

## 2019-12-08 DIAGNOSIS — S0081XA Abrasion of other part of head, initial encounter: Secondary | ICD-10-CM | POA: Diagnosis present

## 2019-12-08 DIAGNOSIS — R310 Gross hematuria: Secondary | ICD-10-CM | POA: Diagnosis present

## 2019-12-08 HISTORY — PX: ORIF PELVIC FRACTURE WITH PERCUTANEOUS SCREWS: SHX6800

## 2019-12-08 HISTORY — PX: IR US GUIDE VASC ACCESS RIGHT: IMG2390

## 2019-12-08 HISTORY — PX: IR AORTAGRAM ABDOMINAL SERIALOGRAM: IMG636

## 2019-12-08 HISTORY — PX: IR RENAL SUPRASEL UNI S&I MOD SED: IMG655

## 2019-12-08 HISTORY — PX: IR EMBO ART  VEN HEMORR LYMPH EXTRAV  INC GUIDE ROADMAPPING: IMG5450

## 2019-12-08 HISTORY — DX: Depression, unspecified: F32.A

## 2019-12-08 HISTORY — DX: Other psychoactive substance abuse, uncomplicated: F19.10

## 2019-12-08 LAB — CBC
HCT: 40.3 % (ref 36.0–46.0)
HCT: 39.9 % (ref 36.0–46.0)
Hemoglobin: 13.3 g/dL (ref 12.0–15.0)
Hemoglobin: 13.2 g/dL (ref 12.0–15.0)
MCH: 29.2 pg (ref 26.0–34.0)
MCH: 28.9 pg (ref 26.0–34.0)
MCHC: 33 g/dL (ref 30.0–36.0)
MCHC: 33.1 g/dL (ref 30.0–36.0)
MCV: 88.6 fL (ref 80.0–100.0)
MCV: 87.5 fL (ref 80.0–100.0)
Platelets: 527 10*3/uL — ABNORMAL HIGH (ref 150–400)
Platelets: 302 10*3/uL (ref 150–400)
RBC: 4.55 MIL/uL (ref 3.87–5.11)
RBC: 4.56 MIL/uL (ref 3.87–5.11)
RDW: 14.2 % (ref 11.5–15.5)
RDW: 15.5 % (ref 11.5–15.5)
WBC: 25.7 10*3/uL — ABNORMAL HIGH (ref 4.0–10.5)
WBC: 18.9 10*3/uL — ABNORMAL HIGH (ref 4.0–10.5)
nRBC: 0 % (ref 0.0–0.2)
nRBC: 0 % (ref 0.0–0.2)

## 2019-12-08 LAB — COMPREHENSIVE METABOLIC PANEL
ALT: 271 U/L — ABNORMAL HIGH (ref 0–44)
AST: 366 U/L — ABNORMAL HIGH (ref 15–41)
Albumin: 3.6 g/dL (ref 3.5–5.0)
Alkaline Phosphatase: 76 U/L (ref 38–126)
Anion gap: 11 (ref 5–15)
BUN: 17 mg/dL (ref 6–20)
CO2: 17 mmol/L — ABNORMAL LOW (ref 22–32)
Calcium: 8.5 mg/dL — ABNORMAL LOW (ref 8.9–10.3)
Chloride: 108 mmol/L (ref 98–111)
Creatinine, Ser: 1.02 mg/dL — ABNORMAL HIGH (ref 0.44–1.00)
GFR calc Af Amer: >60 mL/min (ref >60)
GFR calc non Af Amer: >60 mL/min (ref >60)
Glucose, Bld: 158 mg/dL — ABNORMAL HIGH (ref 70–99)
Potassium: 4.3 mmol/L (ref 3.5–5.1)
Sodium: 136 mmol/L (ref 135–145)
Total Bilirubin: 0.4 mg/dL (ref 0.3–1.2)
Total Protein: 6.3 g/dL — ABNORMAL LOW (ref 6.5–8.1)

## 2019-12-08 LAB — LACTIC ACID, PLASMA
Lactic Acid, Venous: 3.7 mmol/L (ref 0.5–1.9)
Lactic Acid, Venous: 3.5 mmol/L (ref 0.5–1.9)

## 2019-12-08 LAB — SAMPLE TO BLOOD BANK

## 2019-12-08 LAB — I-STAT CHEM 8, ED
BUN: 19 mg/dL (ref 6–20)
Calcium, Ion: 1.06 mmol/L — ABNORMAL LOW (ref 1.15–1.40)
Chloride: 107 mmol/L (ref 98–111)
Creatinine, Ser: 0.9 mg/dL (ref 0.44–1.00)
Glucose, Bld: 153 mg/dL — ABNORMAL HIGH (ref 70–99)
HCT: 40 % (ref 36.0–46.0)
Hemoglobin: 13.6 g/dL (ref 12.0–15.0)
Potassium: 4.2 mmol/L (ref 3.5–5.1)
Sodium: 140 mmol/L (ref 135–145)
TCO2: 18 mmol/L — ABNORMAL LOW (ref 22–32)

## 2019-12-08 LAB — PROTIME-INR
INR: 1 (ref 0.8–1.2)
Prothrombin Time: 13 seconds (ref 11.4–15.2)

## 2019-12-08 LAB — BASIC METABOLIC PANEL
Anion gap: 13 (ref 5–15)
BUN: 14 mg/dL (ref 6–20)
CO2: 19 mmol/L — ABNORMAL LOW (ref 22–32)
Calcium: 8.4 mg/dL — ABNORMAL LOW (ref 8.9–10.3)
Chloride: 107 mmol/L (ref 98–111)
Creatinine, Ser: 0.97 mg/dL (ref 0.44–1.00)
GFR calc Af Amer: >60 mL/min (ref >60)
GFR calc non Af Amer: >60 mL/min (ref >60)
Glucose, Bld: 124 mg/dL — ABNORMAL HIGH (ref 70–99)
Potassium: 3.9 mmol/L (ref 3.5–5.1)
Sodium: 139 mmol/L (ref 135–145)

## 2019-12-08 LAB — SURGICAL PCR SCREEN
MRSA, PCR: NEGATIVE
Staphylococcus aureus: NEGATIVE

## 2019-12-08 LAB — URINALYSIS, MICROSCOPIC (REFLEX): RBC / HPF: >50 RBC/hpf (ref 0–5)

## 2019-12-08 LAB — URINALYSIS, ROUTINE W REFLEX MICROSCOPIC

## 2019-12-08 LAB — I-STAT BETA HCG BLOOD, ED (MC, WL, AP ONLY): I-stat hCG, quantitative: <5 m[IU]/mL (ref <5)

## 2019-12-08 LAB — TROPONIN I (HIGH SENSITIVITY)
Troponin I (High Sensitivity): 8 ng/L (ref <18)
Troponin I (High Sensitivity): 15 ng/L (ref <18)

## 2019-12-08 LAB — ETHANOL: Alcohol, Ethyl (B): <10 mg/dL (ref <10)

## 2019-12-08 LAB — HIV ANTIBODY (ROUTINE TESTING W REFLEX): HIV Screen 4th Generation wRfx: NONREACTIVE

## 2019-12-08 LAB — ABO/RH: ABO/RH(D): A POS

## 2019-12-08 LAB — SARS CORONAVIRUS 2 BY RT PCR (HOSPITAL ORDER, PERFORMED IN ~~LOC~~ HOSPITAL LAB): SARS Coronavirus 2: NEGATIVE

## 2019-12-08 SURGERY — CLOSED REDUCTION, PELVIS, WITH PERCUTANEOUS FIXATION
Anesthesia: General | Site: Pelvis | Laterality: Right

## 2019-12-08 MED ORDER — GABAPENTIN 100 MG PO CAPS
100.0000 mg | ORAL_CAPSULE | Freq: Three times a day (TID) | ORAL | Status: DC
  Administered 2019-12-08 – 2019-12-09 (×4): 100 mg via ORAL
  Filled 2019-12-08 (×4): qty 1

## 2019-12-08 MED ORDER — DEXAMETHASONE SODIUM PHOSPHATE 10 MG/ML IJ SOLN
INTRAMUSCULAR | Status: AC
  Filled 2019-12-08: qty 1

## 2019-12-08 MED ORDER — PROPOFOL 10 MG/ML IV BOLUS
INTRAVENOUS | Status: DC | PRN
  Administered 2019-12-08: 200 mg via INTRAVENOUS

## 2019-12-08 MED ORDER — FENTANYL CITRATE (PF) 250 MCG/5ML IJ SOLN
INTRAMUSCULAR | Status: AC
  Filled 2019-12-08: qty 5

## 2019-12-08 MED ORDER — SODIUM CHLORIDE 0.9 % IV BOLUS
1000.0000 mL | Freq: Once | INTRAVENOUS | Status: AC
  Administered 2019-12-08: 1000 mL via INTRAVENOUS

## 2019-12-08 MED ORDER — DEXMEDETOMIDINE (PRECEDEX) IN NS 20 MCG/5ML (4 MCG/ML) IV SYRINGE
PREFILLED_SYRINGE | INTRAVENOUS | Status: AC
  Filled 2019-12-08: qty 10

## 2019-12-08 MED ORDER — ONDANSETRON HCL 4 MG/2ML IJ SOLN: 4.0000 mg | Freq: Four times a day (QID) | INTRAMUSCULAR | Status: DC | PRN

## 2019-12-08 MED ORDER — PHENYLEPHRINE HCL-NACL 10-0.9 MG/250ML-% IV SOLN: INTRAVENOUS | Status: DC | PRN

## 2019-12-08 MED ORDER — MUPIROCIN 2 % EX OINT
1.0000 "application " | TOPICAL_OINTMENT | Freq: Two times a day (BID) | CUTANEOUS | Status: AC
  Administered 2019-12-08 – 2019-12-12 (×9): 1 via NASAL
  Filled 2019-12-08 (×3): qty 22

## 2019-12-08 MED ORDER — MEPERIDINE HCL 25 MG/ML IJ SOLN: 6.2500 mg | INTRAMUSCULAR | Status: DC | PRN

## 2019-12-08 MED ORDER — 0.9 % SODIUM CHLORIDE (POUR BTL) OPTIME
TOPICAL | Status: DC | PRN
  Administered 2019-12-08: 1000 mL

## 2019-12-08 MED ORDER — IOHEXOL 300 MG/ML  SOLN
100.0000 mL | Freq: Once | INTRAMUSCULAR | Status: AC | PRN
  Administered 2019-12-08: 50 mL via INTRA_ARTERIAL

## 2019-12-08 MED ORDER — CHLORHEXIDINE GLUCONATE 0.12 % MT SOLN
OROMUCOSAL | Status: AC
  Administered 2019-12-08: 15 mL via OROMUCOSAL
  Filled 2019-12-08: qty 15

## 2019-12-08 MED ORDER — PHENYLEPHRINE HCL-NACL 10-0.9 MG/250ML-% IV SOLN
INTRAVENOUS | Status: DC | PRN
  Administered 2019-12-08: 20 ug/min via INTRAVENOUS

## 2019-12-08 MED ORDER — METHOCARBAMOL 500 MG PO TABS
500.0000 mg | ORAL_TABLET | Freq: Four times a day (QID) | ORAL | Status: DC | PRN
  Administered 2019-12-08 – 2019-12-09 (×2): 500 mg via ORAL
  Filled 2019-12-08 (×2): qty 1

## 2019-12-08 MED ORDER — MORPHINE SULFATE (PF) 4 MG/ML IV SOLN
4.0000 mg | INTRAVENOUS | Status: DC | PRN
  Administered 2019-12-08 (×2): 4 mg via INTRAVENOUS
  Filled 2019-12-08 (×2): qty 1

## 2019-12-08 MED ORDER — HEPARIN SODIUM (PORCINE) 1000 UNIT/ML IJ SOLN
INTRAMUSCULAR | Status: AC
  Filled 2019-12-08: qty 1

## 2019-12-08 MED ORDER — IOHEXOL 300 MG/ML  SOLN
150.0000 mL | Freq: Once | INTRAMUSCULAR | Status: AC | PRN
  Administered 2019-12-08: 125 mL via INTRA_ARTERIAL

## 2019-12-08 MED ORDER — CHLORHEXIDINE GLUCONATE 0.12 % MT SOLN: 15.0000 mL | Freq: Once | OROMUCOSAL | Status: AC

## 2019-12-08 MED ORDER — CEFAZOLIN SODIUM-DEXTROSE 2-4 GM/100ML-% IV SOLN
INTRAVENOUS | Status: AC
  Filled 2019-12-08: qty 100

## 2019-12-08 MED ORDER — MIDAZOLAM HCL 2 MG/2ML IJ SOLN
INTRAMUSCULAR | Status: DC | PRN
  Administered 2019-12-08: 2 mg via INTRAVENOUS

## 2019-12-08 MED ORDER — SUGAMMADEX SODIUM 200 MG/2ML IV SOLN
INTRAVENOUS | Status: DC | PRN
  Administered 2019-12-08: 200 mg via INTRAVENOUS

## 2019-12-08 MED ORDER — METOCLOPRAMIDE HCL 5 MG/ML IJ SOLN
5.0000 mg | Freq: Three times a day (TID) | INTRAMUSCULAR | Status: DC | PRN
  Administered 2019-12-08 – 2019-12-09 (×2): 10 mg via INTRAVENOUS
  Filled 2019-12-08 (×2): qty 2

## 2019-12-08 MED ORDER — MIDAZOLAM HCL 2 MG/2ML IJ SOLN
INTRAMUSCULAR | Status: AC | PRN
  Administered 2019-12-08: 0.5 mg via INTRAVENOUS

## 2019-12-08 MED ORDER — HYDROMORPHONE HCL 1 MG/ML IJ SOLN
INTRAMUSCULAR | Status: AC
  Filled 2019-12-08: qty 1

## 2019-12-08 MED ORDER — ONDANSETRON HCL 4 MG/2ML IJ SOLN
INTRAMUSCULAR | Status: DC | PRN
  Administered 2019-12-08: 4 mg via INTRAVENOUS

## 2019-12-08 MED ORDER — POLYETHYLENE GLYCOL 3350 17 G PO PACK: 17.0000 g | PACK | Freq: Every day | ORAL | Status: DC | PRN

## 2019-12-08 MED ORDER — ONDANSETRON HCL 4 MG/2ML IJ SOLN
INTRAMUSCULAR | Status: AC
  Filled 2019-12-08: qty 2

## 2019-12-08 MED ORDER — ROCURONIUM BROMIDE 10 MG/ML (PF) SYRINGE
PREFILLED_SYRINGE | INTRAVENOUS | Status: DC | PRN
  Administered 2019-12-08: 70 mg via INTRAVENOUS
  Administered 2019-12-08: 30 mg via INTRAVENOUS

## 2019-12-08 MED ORDER — DOCUSATE SODIUM 100 MG PO CAPS
100.0000 mg | ORAL_CAPSULE | Freq: Two times a day (BID) | ORAL | Status: DC
  Administered 2019-12-08 – 2019-12-18 (×11): 100 mg via ORAL
  Filled 2019-12-08 (×16): qty 1

## 2019-12-08 MED ORDER — FENTANYL CITRATE (PF) 250 MCG/5ML IJ SOLN
INTRAMUSCULAR | Status: DC | PRN
  Administered 2019-12-08: 100 ug via INTRAVENOUS
  Administered 2019-12-08: 50 ug via INTRAVENOUS
  Administered 2019-12-08 (×2): 100 ug via INTRAVENOUS
  Administered 2019-12-08 (×3): 50 ug via INTRAVENOUS

## 2019-12-08 MED ORDER — OXYCODONE HCL 5 MG PO TABS
5.0000 mg | ORAL_TABLET | ORAL | Status: DC | PRN
  Administered 2019-12-08: 10 mg via ORAL
  Filled 2019-12-08: qty 2

## 2019-12-08 MED ORDER — LIDOCAINE 2% (20 MG/ML) 5 ML SYRINGE
INTRAMUSCULAR | Status: AC
  Filled 2019-12-08: qty 5

## 2019-12-08 MED ORDER — METHOCARBAMOL 1000 MG/10ML IJ SOLN
1000.0000 mg | Freq: Three times a day (TID) | INTRAVENOUS | Status: DC
  Administered 2019-12-08: 1000 mg via INTRAVENOUS
  Filled 2019-12-08 (×3): qty 10

## 2019-12-08 MED ORDER — MORPHINE SULFATE (PF) 2 MG/ML IV SOLN: 1.0000 mg | INTRAVENOUS | Status: DC | PRN

## 2019-12-08 MED ORDER — CHLORHEXIDINE GLUCONATE CLOTH 2 % EX PADS
6.0000 | MEDICATED_PAD | Freq: Every day | CUTANEOUS | Status: DC
  Administered 2019-12-08 – 2019-12-18 (×12): 6 via TOPICAL

## 2019-12-08 MED ORDER — CEFAZOLIN SODIUM-DEXTROSE 2-3 GM-%(50ML) IV SOLR
INTRAVENOUS | Status: DC | PRN
Start: 2019-12-08 — End: 2019-12-08
  Administered 2019-12-08: 2 g via INTRAVENOUS

## 2019-12-08 MED ORDER — ACETAMINOPHEN 500 MG PO TABS
1000.0000 mg | ORAL_TABLET | Freq: Four times a day (QID) | ORAL | Status: DC
  Administered 2019-12-08 – 2019-12-18 (×35): 1000 mg via ORAL
  Filled 2019-12-08 (×39): qty 2

## 2019-12-08 MED ORDER — MIDAZOLAM HCL 2 MG/2ML IJ SOLN
INTRAMUSCULAR | Status: AC
  Filled 2019-12-08: qty 2

## 2019-12-08 MED ORDER — LACTATED RINGERS IV SOLN: INTRAVENOUS | Status: DC

## 2019-12-08 MED ORDER — METHOCARBAMOL 1000 MG/10ML IJ SOLN
500.0000 mg | Freq: Four times a day (QID) | INTRAVENOUS | Status: DC | PRN
  Administered 2019-12-09: 500 mg via INTRAVENOUS
  Filled 2019-12-08: qty 5

## 2019-12-08 MED ORDER — ALBUMIN HUMAN 5 % IV SOLN
INTRAVENOUS | Status: DC | PRN
Start: 2019-12-08 — End: 2019-12-08

## 2019-12-08 MED ORDER — LIDOCAINE HCL 1 % IJ SOLN
INTRAMUSCULAR | Status: AC
  Filled 2019-12-08: qty 20

## 2019-12-08 MED ORDER — FENTANYL CITRATE (PF) 100 MCG/2ML IJ SOLN
INTRAMUSCULAR | Status: AC | PRN
  Administered 2019-12-08 (×2): 25 ug via INTRAVENOUS

## 2019-12-08 MED ORDER — FENTANYL CITRATE (PF) 100 MCG/2ML IJ SOLN
INTRAMUSCULAR | Status: AC
  Filled 2019-12-08: qty 2

## 2019-12-08 MED ORDER — FENTANYL CITRATE (PF) 100 MCG/2ML IJ SOLN
INTRAMUSCULAR | Status: AC | PRN
  Administered 2019-12-08: 25 ug via INTRAVENOUS

## 2019-12-08 MED ORDER — ACETAMINOPHEN 325 MG PO TABS
650.0000 mg | ORAL_TABLET | ORAL | Status: DC | PRN
  Administered 2019-12-08: 650 mg via ORAL
  Filled 2019-12-08: qty 2

## 2019-12-08 MED ORDER — MORPHINE SULFATE (PF) 2 MG/ML IV SOLN
2.0000 mg | INTRAVENOUS | Status: DC | PRN
  Administered 2019-12-08: 2 mg via INTRAVENOUS
  Filled 2019-12-08: qty 1

## 2019-12-08 MED ORDER — ORAL CARE MOUTH RINSE: 15.0000 mL | Freq: Once | OROMUCOSAL | Status: AC

## 2019-12-08 MED ORDER — GELATIN ABSORBABLE 12-7 MM EX MISC
CUTANEOUS | Status: AC
  Filled 2019-12-08: qty 2

## 2019-12-08 MED ORDER — ASCORBIC ACID 500 MG PO TABS
500.0000 mg | ORAL_TABLET | Freq: Every day | ORAL | Status: DC
  Administered 2019-12-08 – 2019-12-18 (×11): 500 mg via ORAL
  Filled 2019-12-08 (×11): qty 1

## 2019-12-08 MED ORDER — CEFAZOLIN SODIUM-DEXTROSE 2-4 GM/100ML-% IV SOLN
2.0000 g | Freq: Three times a day (TID) | INTRAVENOUS | Status: AC
  Administered 2019-12-08 – 2019-12-09 (×3): 2 g via INTRAVENOUS
  Filled 2019-12-08 (×3): qty 100

## 2019-12-08 MED ORDER — PROPOFOL 10 MG/ML IV BOLUS
INTRAVENOUS | Status: AC
  Filled 2019-12-08: qty 20

## 2019-12-08 MED ORDER — ONDANSETRON HCL 4 MG PO TABS
4.0000 mg | ORAL_TABLET | Freq: Four times a day (QID) | ORAL | Status: DC | PRN
  Administered 2019-12-09: 4 mg via ORAL
  Filled 2019-12-08: qty 1

## 2019-12-08 MED ORDER — ONDANSETRON 4 MG PO TBDP
4.0000 mg | ORAL_TABLET | Freq: Four times a day (QID) | ORAL | Status: DC | PRN
  Filled 2019-12-08: qty 1

## 2019-12-08 MED ORDER — LIDOCAINE 2% (20 MG/ML) 5 ML SYRINGE
INTRAMUSCULAR | Status: DC | PRN
  Administered 2019-12-08: 100 mg via INTRAVENOUS

## 2019-12-08 MED ORDER — FENTANYL CITRATE (PF) 100 MCG/2ML IJ SOLN
INTRAMUSCULAR | Status: AC | PRN
  Administered 2019-12-08 (×2): 50 ug via INTRAVENOUS

## 2019-12-08 MED ORDER — HYDROMORPHONE HCL 1 MG/ML IJ SOLN
0.5000 mg | INTRAMUSCULAR | Status: DC | PRN
  Administered 2019-12-08 (×2): 0.5 mg via INTRAVENOUS
  Administered 2019-12-09 (×2): 1 mg via INTRAVENOUS
  Filled 2019-12-08 (×4): qty 1

## 2019-12-08 MED ORDER — SODIUM CHLORIDE 0.9 % IV SOLN: INTRAVENOUS | Status: DC

## 2019-12-08 MED ORDER — METHOCARBAMOL 1000 MG/10ML IJ SOLN
1000.0000 mg | Freq: Three times a day (TID) | INTRAVENOUS | Status: DC
  Filled 2019-12-08 (×3): qty 10

## 2019-12-08 MED ORDER — ONDANSETRON HCL 4 MG/2ML IJ SOLN
4.0000 mg | Freq: Four times a day (QID) | INTRAMUSCULAR | Status: DC | PRN
  Administered 2019-12-08 – 2019-12-16 (×2): 4 mg via INTRAVENOUS
  Filled 2019-12-08 (×2): qty 2

## 2019-12-08 MED ORDER — MIDAZOLAM HCL 2 MG/2ML IJ SOLN
INTRAMUSCULAR | Status: AC | PRN
  Administered 2019-12-08 (×2): 1 mg via INTRAVENOUS

## 2019-12-08 MED ORDER — HYDROMORPHONE HCL 1 MG/ML IJ SOLN
0.2500 mg | INTRAMUSCULAR | Status: DC | PRN
  Administered 2019-12-08 (×3): 0.5 mg via INTRAVENOUS
  Filled 2019-12-08: qty 1

## 2019-12-08 MED ORDER — SUCCINYLCHOLINE CHLORIDE 200 MG/10ML IV SOSY
PREFILLED_SYRINGE | INTRAVENOUS | Status: AC
  Filled 2019-12-08: qty 10

## 2019-12-08 MED ORDER — DEXMEDETOMIDINE (PRECEDEX) IN NS 20 MCG/5ML (4 MCG/ML) IV SYRINGE
PREFILLED_SYRINGE | INTRAVENOUS | Status: DC | PRN
  Administered 2019-12-08: 8 ug via INTRAVENOUS
  Administered 2019-12-08: 4 ug via INTRAVENOUS
  Administered 2019-12-08: 8 ug via INTRAVENOUS

## 2019-12-08 MED ORDER — DEXAMETHASONE SODIUM PHOSPHATE 10 MG/ML IJ SOLN
INTRAMUSCULAR | Status: DC | PRN
  Administered 2019-12-08: 10 mg via INTRAVENOUS

## 2019-12-08 MED ORDER — ROCURONIUM BROMIDE 10 MG/ML (PF) SYRINGE
PREFILLED_SYRINGE | INTRAVENOUS | Status: AC
  Filled 2019-12-08: qty 10

## 2019-12-08 MED ORDER — PHENYLEPHRINE 40 MCG/ML (10ML) SYRINGE FOR IV PUSH (FOR BLOOD PRESSURE SUPPORT)
PREFILLED_SYRINGE | INTRAVENOUS | Status: DC | PRN
  Administered 2019-12-08: 120 ug via INTRAVENOUS
  Administered 2019-12-08: 40 ug via INTRAVENOUS
  Administered 2019-12-08 (×3): 80 ug via INTRAVENOUS

## 2019-12-08 MED ORDER — OXYCODONE HCL 5 MG PO TABS
10.0000 mg | ORAL_TABLET | ORAL | Status: DC | PRN
  Administered 2019-12-09: 15 mg via ORAL
  Administered 2019-12-09: 10 mg via ORAL
  Filled 2019-12-08: qty 2
  Filled 2019-12-08: qty 3

## 2019-12-08 MED ORDER — IOHEXOL 300 MG/ML  SOLN
100.0000 mL | Freq: Once | INTRAMUSCULAR | Status: AC | PRN
  Administered 2019-12-08: 100 mL via INTRAVENOUS

## 2019-12-08 MED ORDER — METOCLOPRAMIDE HCL 10 MG PO TABS: 5.0000 mg | ORAL_TABLET | Freq: Three times a day (TID) | ORAL | Status: DC | PRN

## 2019-12-08 MED ORDER — ONDANSETRON HCL 4 MG/2ML IJ SOLN: 4.0000 mg | Freq: Once | INTRAMUSCULAR | Status: DC | PRN

## 2019-12-08 SURGICAL SUPPLY — 47 items
BIT DRILL CANN 4.5MM (BIT) ×1 IMPLANT
BLADE CLIPPER SURG (BLADE) IMPLANT
BLADE SURG 11 STRL SS (BLADE) ×3 IMPLANT
CHLORAPREP W/TINT 26 (MISCELLANEOUS) ×3 IMPLANT
COVER WAND RF STERILE (DRAPES) ×3 IMPLANT
DERMABOND ADVANCED (GAUZE/BANDAGES/DRESSINGS)
DERMABOND ADVANCED .7 DNX12 (GAUZE/BANDAGES/DRESSINGS) IMPLANT
DRAPE C-ARM 42X72 X-RAY (DRAPES) ×3 IMPLANT
DRAPE C-ARMOR (DRAPES) ×3 IMPLANT
DRAPE HALF SHEET 40X57 (DRAPES) ×3 IMPLANT
DRAPE INCISE IOBAN 66X45 STRL (DRAPES) ×3 IMPLANT
DRAPE SURG 17X23 STRL (DRAPES) ×18 IMPLANT
DRAPE U-SHAPE 47X51 STRL (DRAPES) ×3 IMPLANT
DRILL BIT CANN 4.5MM (BIT) ×2
DRSG MEPILEX BORDER 4X4 (GAUZE/BANDAGES/DRESSINGS) IMPLANT
DRSG MEPILEX BORDER 4X8 (GAUZE/BANDAGES/DRESSINGS) IMPLANT
ELECT REM PT RETURN 9FT ADLT (ELECTROSURGICAL) ×3
ELECTRODE REM PT RTRN 9FT ADLT (ELECTROSURGICAL) ×1 IMPLANT
GLOVE BIO SURGEON STRL SZ 6.5 (GLOVE) ×6 IMPLANT
GLOVE BIO SURGEON STRL SZ7.5 (GLOVE) ×12 IMPLANT
GLOVE BIO SURGEONS STRL SZ 6.5 (GLOVE) ×3
GLOVE BIOGEL PI IND STRL 6.5 (GLOVE) ×1 IMPLANT
GLOVE BIOGEL PI IND STRL 7.5 (GLOVE) ×1 IMPLANT
GLOVE BIOGEL PI INDICATOR 6.5 (GLOVE) ×2
GLOVE BIOGEL PI INDICATOR 7.5 (GLOVE) ×2
GOWN STRL REUS W/ TWL LRG LVL3 (GOWN DISPOSABLE) ×2 IMPLANT
GOWN STRL REUS W/TWL LRG LVL3 (GOWN DISPOSABLE) ×4
GUIDEWIRE 2.0MM (WIRE) ×3 IMPLANT
GUIDEWIRE THREADED 2.8MM (WIRE) ×6 IMPLANT
KIT BASIN OR (CUSTOM PROCEDURE TRAY) ×3 IMPLANT
KIT TURNOVER KIT B (KITS) ×3 IMPLANT
MANIFOLD NEPTUNE II (INSTRUMENTS) ×3 IMPLANT
NS IRRIG 1000ML POUR BTL (IV SOLUTION) ×3 IMPLANT
PACK TOTAL JOINT (CUSTOM PROCEDURE TRAY) ×3 IMPLANT
PACK UNIVERSAL I (CUSTOM PROCEDURE TRAY) ×3 IMPLANT
PAD ARMBOARD 7.5X6 YLW CONV (MISCELLANEOUS) ×6 IMPLANT
SCREW CANN 32 THRD/80 7.3 (Screw) ×3 IMPLANT
SCREW CANN 7.3X140MM (Screw) ×3 IMPLANT
SPONGE LAP 18X18 RF (DISPOSABLE) IMPLANT
STAPLER VISISTAT 35W (STAPLE) ×3 IMPLANT
SUCTION FRAZIER HANDLE 10FR (MISCELLANEOUS) ×2
SUCTION TUBE FRAZIER 10FR DISP (MISCELLANEOUS) ×1 IMPLANT
SUT MNCRL AB 3-0 PS2 18 (SUTURE) ×3 IMPLANT
SUT MON AB 2-0 CT1 36 (SUTURE) ×3 IMPLANT
TRAY FOLEY MTR SLVR 16FR STAT (SET/KITS/TRAYS/PACK) IMPLANT
WASHER FOR 5.0 SCREWS (Washer) ×6 IMPLANT
WATER STERILE IRR 1000ML POUR (IV SOLUTION) ×3 IMPLANT
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
## 2019-12-08 NOTE — Transfer of Care (Signed)
Immediate Anesthesia Transfer of Care Note  Patient: Topaz Raglin  Procedure(s) Performed: ORIF PELVIC FRACTURE WITH PERCUTANEOUS SCREWS (Right Pelvis)  Patient Location: PACU  Anesthesia Type:General  Level of Consciousness: awake, alert  and oriented  Airway & Oxygen Therapy: Patient Spontanous Breathing and Patient connected to nasal cannula oxygen  Post-op Assessment: Report given to RN and Post -op Vital signs reviewed and stable  Post vital signs: Reviewed and stable  Last Vitals:  Vitals Value Taken Time  BP 117/98 12/08/19 1809  Temp 37 C 12/08/19 1809  Pulse 93 12/08/19 1816  Resp 15 12/08/19 1816  SpO2 100 % 12/08/19 1816  Vitals shown include unvalidated device data.  Last Pain:  Vitals:   12/08/19 1809  TempSrc:   PainSc: 10-Worst pain ever      Patients Stated Pain Goal: 0 (12/08/19 1139)  Complications: No complications documented.

## 2019-12-08 NOTE — Procedures (Signed)
Interventional Radiology Procedure Note  Procedure: left renal angio embo of upper pole segmental branch  Distal aortogram/pelvic angio    Complications: None  Estimated Blood Loss:  min  Findings: LUP segmental branch vascular injury with staining and stagnant flow successfully embo'd with 2 mm coils  Distal aortogram/pelvic angio neg  Full report in pacs     Sharen Counter, MD
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
## 2019-12-08 NOTE — Anesthesia Postprocedure Evaluation (Signed)
Anesthesia Post Note  Patient: Ashley Choi  Procedure(s) Performed: ORIF PELVIC FRACTURE WITH PERCUTANEOUS SCREWS (Right Pelvis)     Patient location during evaluation: PACU Anesthesia Type: General Level of consciousness: awake and alert Pain management: pain level controlled Vital Signs Assessment: post-procedure vital signs reviewed and stable Respiratory status: spontaneous breathing, nonlabored ventilation, respiratory function stable and patient connected to nasal cannula oxygen Cardiovascular status: blood pressure returned to baseline and stable Postop Assessment: no apparent nausea or vomiting Anesthetic complications: no   No complications documented.  Last Vitals:  Vitals:   12/08/19 1945 12/08/19 2000  BP: (!) 165/150 140/87  Pulse: 84 81  Resp:  14  Temp:    SpO2: 99% 97%    Last Pain:  Vitals:   12/08/19 1900  TempSrc:   PainSc: 10-Worst pain ever                 [name] DAVID
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
## 2019-12-08 NOTE — ED Notes (Signed)
[place] J placed 

## 2019-12-08 NOTE — ED Notes (Signed)
Dr Franky Macho w/ neurosurgery at the bedside

## 2019-12-08 NOTE — ED Notes (Signed)
IV access attempted X2 without success. EDP to attempt IV access.
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
## 2019-12-08 NOTE — Anesthesia Procedure Notes (Signed)
Procedure Name: Intubation Date/Time: 12/08/2019 3:52 PM Performed by: Lance Coon, CRNA Pre-anesthesia Checklist: Emergency Drugs available, Suction available, Patient being monitored, Timeout performed and Patient identified Patient Re-evaluated:Patient Re-evaluated prior to induction Oxygen Delivery Method: Circle system utilized Preoxygenation: Pre-oxygenation with 100% oxygen Induction Type: IV induction Ventilation: Mask ventilation without difficulty and Oral airway inserted - appropriate to patient size Laryngoscope Size: Mac, Glidescope and 3 Grade View: Grade I Tube size: 7.0 mm Number of attempts: 1 Airway Equipment and Method: Stylet and Video-laryngoscopy Placement Confirmation: ETT inserted through vocal cords under direct vision,  positive ETCO2,  CO2 detector and breath sounds checked- equal and bilateral Secured at: 23 cm Tube secured with: Tape Dental Injury: Teeth and Oropharynx as per pre-operative assessment  Comments: Cspine maintained stabilization throughout glide used d/t cspine precautions

## 2019-12-08 NOTE — Progress Notes (Signed)
Orthopedic Tech Progress Note Patient Details:  Ashley Choi January 28, 1991 [id number]  Musculoskeletal Traction Type of Traction: Bucks Skin Traction Traction Location: RLE Traction Weight: 10 lbs   Post Interventions Patient Tolerated: Well Instructions Provided: Care of device   [name] 12/08/2019, 12:13 PM
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
## 2019-12-08 NOTE — Anesthesia Preprocedure Evaluation (Signed)
Anesthesia Evaluation  Patient identified by MRN, date of birth, ID band Patient awake    Reviewed: Allergy & Precautions, NPO status , Patient's Chart, lab work & pertinent test results  Airway Mallampati: I  TM Distance: >3 FB Neck ROM: Full    Dental   Pulmonary Current Smoker,    Pulmonary exam normal        Cardiovascular Normal cardiovascular exam     Neuro/Psych    GI/Hepatic   Endo/Other    Renal/GU      Musculoskeletal   Abdominal   Peds  Hematology   Anesthesia Other Findings   Reproductive/Obstetrics                             Anesthesia Physical Anesthesia Plan  ASA: III  Anesthesia Plan:    Post-op Pain Management:    Induction:   PONV Risk Score and Plan: Ondansetron  Airway Management Planned: Oral ETT  Additional Equipment:   Intra-op Plan:   Post-operative Plan: Extubation in OR  Informed Consent: I have reviewed the patients History and Physical, chart, labs and discussed the procedure including the risks, benefits and alternatives for the proposed anesthesia with the patient or authorized representative who has indicated his/her understanding and acceptance.       Plan Discussed with: CRNA and Surgeon  Anesthesia Plan Comments:         Anesthesia Quick Evaluation

## 2019-12-09 ENCOUNTER — Other Ambulatory Visit: Payer: Self-pay

## 2019-12-09 ENCOUNTER — Encounter (HOSPITAL_COMMUNITY): Payer: Self-pay

## 2019-12-09 ENCOUNTER — Inpatient Hospital Stay (HOSPITAL_COMMUNITY): Payer: Medicaid Other

## 2019-12-09 LAB — TYPE AND SCREEN
ABO/RH(D): A POS
Antibody Screen: NEGATIVE
Unit division: 0
Unit division: 0

## 2019-12-09 LAB — CBC
HCT: 26.8 % — ABNORMAL LOW (ref 36.0–46.0)
HCT: 29.1 % — ABNORMAL LOW (ref 36.0–46.0)
Hemoglobin: 9.1 g/dL — ABNORMAL LOW (ref 12.0–15.0)
Hemoglobin: 9.4 g/dL — ABNORMAL LOW (ref 12.0–15.0)
MCH: 28.9 pg (ref 26.0–34.0)
MCH: 28.8 pg (ref 26.0–34.0)
MCHC: 34 g/dL (ref 30.0–36.0)
MCHC: 32.3 g/dL (ref 30.0–36.0)
MCV: 85.1 fL (ref 80.0–100.0)
MCV: 89.3 fL (ref 80.0–100.0)
Platelets: 182 10*3/uL (ref 150–400)
Platelets: UNDETERMINED 10*3/uL (ref 150–400)
RBC: 3.15 MIL/uL — ABNORMAL LOW (ref 3.87–5.11)
RBC: 3.26 MIL/uL — ABNORMAL LOW (ref 3.87–5.11)
RDW: 15.7 % — ABNORMAL HIGH (ref 11.5–15.5)
RDW: 15.9 % — ABNORMAL HIGH (ref 11.5–15.5)
WBC: 13.8 10*3/uL — ABNORMAL HIGH (ref 4.0–10.5)
WBC: 11.2 10*3/uL — ABNORMAL HIGH (ref 4.0–10.5)
nRBC: 0 % (ref 0.0–0.2)
nRBC: 0 % (ref 0.0–0.2)

## 2019-12-09 LAB — COMPREHENSIVE METABOLIC PANEL
ALT: 153 U/L — ABNORMAL HIGH (ref 0–44)
AST: 172 U/L — ABNORMAL HIGH (ref 15–41)
Albumin: 3.1 g/dL — ABNORMAL LOW (ref 3.5–5.0)
Alkaline Phosphatase: 49 U/L (ref 38–126)
Anion gap: 13 (ref 5–15)
BUN: 16 mg/dL (ref 6–20)
CO2: 15 mmol/L — ABNORMAL LOW (ref 22–32)
Calcium: 7.9 mg/dL — ABNORMAL LOW (ref 8.9–10.3)
Chloride: 109 mmol/L (ref 98–111)
Creatinine, Ser: 1.17 mg/dL — ABNORMAL HIGH (ref 0.44–1.00)
GFR calc Af Amer: >60 mL/min (ref >60)
GFR calc non Af Amer: >60 mL/min (ref >60)
Glucose, Bld: 142 mg/dL — ABNORMAL HIGH (ref 70–99)
Potassium: 3.9 mmol/L (ref 3.5–5.1)
Sodium: 137 mmol/L (ref 135–145)
Total Bilirubin: 0.6 mg/dL (ref 0.3–1.2)
Total Protein: 5.3 g/dL — ABNORMAL LOW (ref 6.5–8.1)

## 2019-12-09 LAB — PREPARE FRESH FROZEN PLASMA
Unit division: 0
Unit division: 0

## 2019-12-09 LAB — BPAM FFP
Blood Product Expiration Date: 202108052359
Blood Product Expiration Date: 202108052359
ISSUE DATE / TIME: 202108020903
ISSUE DATE / TIME: 202108020903
Unit Type and Rh: 6200
Unit Type and Rh: 6200

## 2019-12-09 LAB — PROTIME-INR
INR: 1.2 (ref 0.8–1.2)
Prothrombin Time: 15.1 s (ref 11.4–15.2)

## 2019-12-09 LAB — BPAM RBC
Blood Product Expiration Date: 202108302359
Blood Product Expiration Date: 202108312359
ISSUE DATE / TIME: 202108020856
ISSUE DATE / TIME: 202108020856
Unit Type and Rh: 9500
Unit Type and Rh: 9500

## 2019-12-09 LAB — BLOOD PRODUCT ORDER (VERBAL) VERIFICATION

## 2019-12-09 LAB — VITAMIN D 25 HYDROXY (VIT D DEFICIENCY, FRACTURES): Vit D, 25-Hydroxy: 13.43 ng/mL — ABNORMAL LOW (ref 30–100)

## 2019-12-09 MED ORDER — OXYCODONE HCL 5 MG PO TABS
10.0000 mg | ORAL_TABLET | ORAL | Status: DC | PRN
  Administered 2019-12-09 – 2019-12-10 (×3): 10 mg via ORAL
  Filled 2019-12-09 (×4): qty 2

## 2019-12-09 MED ORDER — NICOTINE 14 MG/24HR TD PT24
14.0000 mg | MEDICATED_PATCH | Freq: Every day | TRANSDERMAL | Status: DC
  Administered 2019-12-09 – 2019-12-18 (×10): 14 mg via TRANSDERMAL
  Filled 2019-12-09 (×10): qty 1

## 2019-12-09 MED ORDER — METHOCARBAMOL 500 MG PO TABS
1000.0000 mg | ORAL_TABLET | Freq: Three times a day (TID) | ORAL | Status: DC
  Administered 2019-12-09 – 2019-12-15 (×18): 1000 mg via ORAL
  Filled 2019-12-09 (×18): qty 2

## 2019-12-09 MED ORDER — VITAMIN D 25 MCG (1000 UNIT) PO TABS
2000.0000 [IU] | ORAL_TABLET | Freq: Two times a day (BID) | ORAL | Status: DC
  Administered 2019-12-09 – 2019-12-18 (×19): 2000 [IU] via ORAL
  Filled 2019-12-09 (×19): qty 2

## 2019-12-09 MED ORDER — LORAZEPAM 2 MG/ML IJ SOLN
1.0000 mg | INTRAMUSCULAR | Status: DC | PRN
  Administered 2019-12-09 – 2019-12-15 (×31): 1 mg via INTRAVENOUS
  Filled 2019-12-09 (×32): qty 1

## 2019-12-09 MED ORDER — PNEUMOCOCCAL VAC POLYVALENT 25 MCG/0.5ML IJ INJ
0.5000 mL | INJECTION | INTRAMUSCULAR | Status: DC
  Filled 2019-12-09: qty 0.5

## 2019-12-09 MED ORDER — HYDROMORPHONE HCL 1 MG/ML IJ SOLN
0.5000 mg | INTRAMUSCULAR | Status: DC | PRN
  Administered 2019-12-10 (×2): 0.5 mg via INTRAVENOUS
  Filled 2019-12-09 (×2): qty 0.5

## 2019-12-09 MED ORDER — OXYCODONE HCL 5 MG PO TABS
15.0000 mg | ORAL_TABLET | ORAL | Status: DC | PRN
  Administered 2019-12-09 – 2019-12-18 (×34): 15 mg via ORAL
  Filled 2019-12-09 (×35): qty 3

## 2019-12-09 NOTE — Progress Notes (Signed)
OT Cancellation Note  Patient Details Name: Ashley Choi MRN: [id number] DOB: 03-16-1991   Cancelled Treatment:    Reason Eval/Treat Not Completed: Medical issues which prohibited therapy; pt remains on bedrest. Will follow.  Marcy Siren, OT Acute Rehabilitation Services Pager [phone] Office [phone]   Orlando Penner 12/09/2019, 7:42 AM

## 2019-12-09 NOTE — Plan of Care (Signed)
  Problem: Clinical Measurements: Goal: Respiratory complications will improve Outcome: Progressing Goal: Cardiovascular complication will be avoided Outcome: Progressing   Problem: Activity: Goal: Risk for activity intolerance will decrease Outcome: Progressing   Problem: Elimination: Goal: Will not experience complications related to bowel motility Outcome: Progressing Goal: Will not experience complications related to urinary retention Outcome: Progressing   Problem: Education: Goal: Knowledge of General Education information will improve Description: Including pain rating scale, medication(s)/side effects and non-pharmacologic comfort measures Outcome: Not Progressing   Problem: Health Behavior/Discharge Planning: Goal: Ability to manage health-related needs will improve Outcome: Not Progressing   Problem: Clinical Measurements: Goal: Ability to maintain clinical measurements within normal limits will improve Outcome: Not Progressing Goal: Will remain free from infection Outcome: Not Progressing Goal: Diagnostic test results will improve Outcome: Not Progressing   Problem: Nutrition: Goal: Adequate nutrition will be maintained Outcome: Not Progressing   Problem: Coping: Goal: Level of anxiety will decrease Outcome: Not Progressing   Problem: Pain Managment: Goal: General experience of comfort will improve Outcome: Not Progressing   Problem: Safety: Goal: Ability to remain free from injury will improve Outcome: Not Progressing   Problem: Skin Integrity: Goal: Risk for impaired skin integrity will decrease Outcome: Not Progressing
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
## 2019-12-09 NOTE — Progress Notes (Signed)
Patient intermittently anxious and agitated with frequent demands for repositioning (up to 6 times per hour). Patient frequently moving herself in bed. Primary RN (and other assisting RNs) educated patient many times on importance of not twisting/moving head/bending to prevent furthering her injuries. Patient continues to move herself and demand repositioning. Pain medication given as patient condition allowed. MD notified of patient's anxiety and ativan ordered and given.
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
## 2019-12-09 NOTE — Progress Notes (Signed)
Initial visitors: Ashley Choi - pt sister  Change of visitors today due to sister not being able to make it to hospital: Ashley Choi- grandmother and Ashley Choi- aunt  Patient is requesting that her twin sister be the visitors now- Ashley Choi and Ashley Choi   Patient and family reminded of visitation policy with "same two visitors per entire patient stay"

## 2019-12-09 NOTE — Progress Notes (Signed)
Onda Mauritania and Donney Rankins are new designated visitors. They are the patients grandmother and aunt.

## 2019-12-10 LAB — CBC
HCT: 23.9 % — ABNORMAL LOW (ref 36.0–46.0)
Hemoglobin: 8.3 g/dL — ABNORMAL LOW (ref 12.0–15.0)
MCH: 29.2 pg (ref 26.0–34.0)
MCHC: 34.7 g/dL (ref 30.0–36.0)
MCV: 84.2 fL (ref 80.0–100.0)
Platelets: 161 10*3/uL (ref 150–400)
RBC: 2.84 MIL/uL — ABNORMAL LOW (ref 3.87–5.11)
RDW: 15.5 % (ref 11.5–15.5)
WBC: 11.3 10*3/uL — ABNORMAL HIGH (ref 4.0–10.5)
nRBC: 0 % (ref 0.0–0.2)

## 2019-12-10 LAB — COMPREHENSIVE METABOLIC PANEL
ALT: 120 U/L — ABNORMAL HIGH (ref 0–44)
AST: 145 U/L — ABNORMAL HIGH (ref 15–41)
Albumin: 2.9 g/dL — ABNORMAL LOW (ref 3.5–5.0)
Alkaline Phosphatase: 47 U/L (ref 38–126)
Anion gap: 12 (ref 5–15)
BUN: 11 mg/dL (ref 6–20)
CO2: 21 mmol/L — ABNORMAL LOW (ref 22–32)
Calcium: 8.2 mg/dL — ABNORMAL LOW (ref 8.9–10.3)
Chloride: 105 mmol/L (ref 98–111)
Creatinine, Ser: 0.9 mg/dL (ref 0.44–1.00)
GFR calc Af Amer: >60 mL/min (ref >60)
GFR calc non Af Amer: >60 mL/min (ref >60)
Glucose, Bld: 113 mg/dL — ABNORMAL HIGH (ref 70–99)
Potassium: 3.8 mmol/L (ref 3.5–5.1)
Sodium: 138 mmol/L (ref 135–145)
Total Bilirubin: 0.8 mg/dL (ref 0.3–1.2)
Total Protein: 5.4 g/dL — ABNORMAL LOW (ref 6.5–8.1)

## 2019-12-10 MED ORDER — POLYETHYLENE GLYCOL 3350 17 G PO PACK
17.0000 g | PACK | Freq: Every day | ORAL | Status: DC
  Administered 2019-12-10 – 2019-12-13 (×4): 17 g via ORAL
  Filled 2019-12-10 (×5): qty 1

## 2019-12-10 MED ORDER — HYDROMORPHONE HCL 1 MG/ML IJ SOLN
0.5000 mg | INTRAMUSCULAR | Status: DC | PRN
  Administered 2019-12-10: 0.5 mg via INTRAVENOUS
  Administered 2019-12-10 – 2019-12-15 (×25): 1 mg via INTRAVENOUS
  Filled 2019-12-10 (×28): qty 1

## 2019-12-10 MED ORDER — TRAMADOL HCL 50 MG PO TABS
50.0000 mg | ORAL_TABLET | Freq: Four times a day (QID) | ORAL | Status: DC
  Administered 2019-12-10 – 2019-12-12 (×9): 50 mg via ORAL
  Filled 2019-12-10 (×9): qty 1

## 2019-12-10 MED ORDER — GABAPENTIN 300 MG PO CAPS
300.0000 mg | ORAL_CAPSULE | Freq: Three times a day (TID) | ORAL | Status: DC
  Administered 2019-12-10 – 2019-12-15 (×16): 300 mg via ORAL
  Filled 2019-12-10 (×16): qty 1

## 2019-12-10 NOTE — Progress Notes (Signed)
OT Cancellation Note  Patient Details Name: Ashley Choi MRN: [id number] DOB: 06-Dec-1990   Cancelled Treatment:    Reason Eval/Treat Not Completed: Active bedrest order, will follow.   Marcy Siren, OT Acute Rehabilitation Services Pager [phone] Office [phone]   Orlando Penner 12/10/2019, 8:11 AM
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
## 2019-12-11 ENCOUNTER — Inpatient Hospital Stay (HOSPITAL_COMMUNITY): Payer: Medicaid Other

## 2019-12-11 DIAGNOSIS — S3289XA Fracture of other parts of pelvis, initial encounter for closed fracture: Secondary | ICD-10-CM

## 2019-12-11 LAB — CBC
HCT: 21.7 % — ABNORMAL LOW (ref 36.0–46.0)
HCT: 25.8 % — ABNORMAL LOW (ref 36.0–46.0)
Hemoglobin: 7.4 g/dL — ABNORMAL LOW (ref 12.0–15.0)
Hemoglobin: 8.7 g/dL — ABNORMAL LOW (ref 12.0–15.0)
MCH: 29.1 pg (ref 26.0–34.0)
MCH: 29 pg (ref 26.0–34.0)
MCHC: 34.1 g/dL (ref 30.0–36.0)
MCHC: 33.7 g/dL (ref 30.0–36.0)
MCV: 85.4 fL (ref 80.0–100.0)
MCV: 86 fL (ref 80.0–100.0)
Platelets: 168 10*3/uL (ref 150–400)
Platelets: 163 10*3/uL (ref 150–400)
RBC: 2.54 MIL/uL — ABNORMAL LOW (ref 3.87–5.11)
RBC: 3 MIL/uL — ABNORMAL LOW (ref 3.87–5.11)
RDW: 15.2 % (ref 11.5–15.5)
RDW: 14.7 % (ref 11.5–15.5)
WBC: 13 10*3/uL — ABNORMAL HIGH (ref 4.0–10.5)
WBC: 14.1 10*3/uL — ABNORMAL HIGH (ref 4.0–10.5)
nRBC: 0 % (ref 0.0–0.2)
nRBC: 0.1 % (ref 0.0–0.2)

## 2019-12-11 LAB — COMPREHENSIVE METABOLIC PANEL
ALT: 72 U/L — ABNORMAL HIGH (ref 0–44)
AST: 74 U/L — ABNORMAL HIGH (ref 15–41)
Albumin: 2.6 g/dL — ABNORMAL LOW (ref 3.5–5.0)
Alkaline Phosphatase: 51 U/L (ref 38–126)
Anion gap: 7 (ref 5–15)
BUN: 9 mg/dL (ref 6–20)
CO2: 23 mmol/L (ref 22–32)
Calcium: 7.9 mg/dL — ABNORMAL LOW (ref 8.9–10.3)
Chloride: 106 mmol/L (ref 98–111)
Creatinine, Ser: 0.71 mg/dL (ref 0.44–1.00)
GFR calc Af Amer: >60 mL/min (ref >60)
GFR calc non Af Amer: >60 mL/min (ref >60)
Glucose, Bld: 99 mg/dL (ref 70–99)
Potassium: 3.7 mmol/L (ref 3.5–5.1)
Sodium: 136 mmol/L (ref 135–145)
Total Bilirubin: 1 mg/dL (ref 0.3–1.2)
Total Protein: 5.2 g/dL — ABNORMAL LOW (ref 6.5–8.1)

## 2019-12-11 LAB — PREPARE RBC (CROSSMATCH)

## 2019-12-11 MED ORDER — IOHEXOL 300 MG/ML  SOLN
100.0000 mL | Freq: Once | INTRAMUSCULAR | Status: AC | PRN
  Administered 2019-12-11: 100 mL via INTRAVENOUS

## 2019-12-11 MED ORDER — SODIUM CHLORIDE 0.9% IV SOLUTION: Freq: Once | INTRAVENOUS | Status: AC

## 2019-12-11 MED ORDER — SODIUM CHLORIDE 0.9% FLUSH
10.0000 mL | INTRAVENOUS | Status: DC | PRN
  Administered 2019-12-12: 10 mL

## 2019-12-11 NOTE — Progress Notes (Signed)
Inpatient Rehab Admissions Coordinator Note:   Per therapy recommendations, pt was screened for CIR candidacy by Estill Dooms, PT, DPT.  At this time we are recommending a CIR consult and I will place an order per our protocol.  Please contact me with questions.   Estill Dooms, PT, DPT [phone] 12/11/19 12:12 PM

## 2019-12-11 NOTE — Progress Notes (Signed)
Chaplain engaged in initial visit with Ashley Choi.  Chaplain introduced herself and offered support.  Ashley Choi expressed today that she is not feeling well and that she is in a lot of pain.  Chaplain continued to offer support and let Ashley Choi know that she is there for her.    Chaplain will continue to follow-up.
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
## 2019-12-11 NOTE — Progress Notes (Signed)
Inpatient Rehab Admissions Coordinator:   Attempted to meet with pt.  Sleeping soundly.  Does not wake for voice or touch.  Will f/u tomorrow.   Estill Dooms, PT, DPT Admissions Coordinator [phone] 12/11/19  4:07 PM
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
## 2019-12-11 NOTE — Progress Notes (Signed)
PT Cancellation Note  Patient Details Name: Ashley Choi MRN: [id number] DOB: 01/04/1991   Cancelled Treatment:    Reason Eval/Treat Not Completed: Active bedrest order   Enedina Finner Chaslyn Eisen 12/11/2019, 7:30 AM  Merryl Hacker, PT Acute Rehabilitation Services Pager: [phone] Office: [phone]

## 2019-12-12 LAB — TYPE AND SCREEN
ABO/RH(D): A POS
Antibody Screen: NEGATIVE
Unit division: 0

## 2019-12-12 LAB — CBC
HCT: 25.4 % — ABNORMAL LOW (ref 36.0–46.0)
Hemoglobin: 8.8 g/dL — ABNORMAL LOW (ref 12.0–15.0)
MCH: 29.5 pg (ref 26.0–34.0)
MCHC: 34.6 g/dL (ref 30.0–36.0)
MCV: 85.2 fL (ref 80.0–100.0)
Platelets: 245 10*3/uL (ref 150–400)
RBC: 2.98 MIL/uL — ABNORMAL LOW (ref 3.87–5.11)
RDW: 15 % (ref 11.5–15.5)
WBC: 13.7 10*3/uL — ABNORMAL HIGH (ref 4.0–10.5)
nRBC: 0.2 % (ref 0.0–0.2)

## 2019-12-12 LAB — BPAM RBC
Blood Product Expiration Date: 202108272359
ISSUE DATE / TIME: 202108051304
Unit Type and Rh: 6200

## 2019-12-12 MED ORDER — TRAMADOL HCL 50 MG PO TABS
100.0000 mg | ORAL_TABLET | Freq: Four times a day (QID) | ORAL | Status: DC
  Administered 2019-12-12 – 2019-12-18 (×25): 100 mg via ORAL
  Filled 2019-12-12 (×26): qty 2
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
## 2019-12-12 NOTE — Progress Notes (Signed)
Patient ID: Denim Start, female   DOB: 1991-01-21, 29 y.o.   MRN: [id number]   I reviewed her CT and the perinephric hematoma is smaller, there is no active bleeding but there may be a small contained urine leak from the renal pelvis posteriorly but there is good antegrade flow into a non-dilated ureter on the left so I think she will just need reimaging in a couple of weeks unless she develops FUO or left flank pain.   There is no obvious bladder extravasation on the delayed views and the foley is in good position.   We will continue to follow.  Maintain foley drainage as previously requested.
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
## 2019-12-12 NOTE — TOC CAGE-AID Note (Signed)
Transition of Care [facility]) - CAGE-AID Screening   Patient Details  Name: Ashley Choi MRN: [id number] Date of Birth: [birth year]  Transition of Care [facility]) CM/SW Contact:    Emeterio Reeve, [place] Phone Number: 12/12/2019, 4:40 PM   Clinical Narrative:  CSW met with pt at bedside. CSW introduced self and explained her role at the hospital.  Pt denied current alcohol use and substance use.  Pt stated that she use to have a problem with substance use she has went through treatment. Pt states she graduated from her treatment program two months ago. CSW praised pt for her accomplishment.   CAGE-AID Screening:    Have You Ever Felt You Ought to Cut Down on Your Drinking or Drug Use?: No Have People Annoyed You By Critizing Your Drinking Or Drug Use?: No Have You Felt Bad Or Guilty About Your Drinking Or Drug Use?: No Have You Ever Had a Drink or Used Drugs First Thing In The Morning to Steady Your Nerves or to Get Rid of a Hangover?: No CAGE-AID Score: 0  Substance Abuse Education Offered: Yes  Substance abuse interventions: Patient Counseling  Emeterio Reeve, Latanya Presser, [place] Social Worker [phone]

## 2019-12-12 NOTE — Progress Notes (Signed)
Inpatient Rehab Admissions:  Inpatient Rehab Consult received.  I met with pt at the bedside while she participated with therapy.  She does have twin sisters who are currently watching her 31 y/o daughter.  Motivated to return home, and participated well with therapy, pushing through her pain.  Will need to confirm dispo, which I will attempt to do today. Working on pain control, will f/u with patient on Monday.   Signed: Shann Medal, PT, DPT Admissions Coordinator [phone] 12/12/19  12:11 PM

## 2019-12-13 LAB — BASIC METABOLIC PANEL
Anion gap: 13 (ref 5–15)
BUN: 8 mg/dL (ref 6–20)
CO2: 18 mmol/L — ABNORMAL LOW (ref 22–32)
Calcium: 7.9 mg/dL — ABNORMAL LOW (ref 8.9–10.3)
Chloride: 108 mmol/L (ref 98–111)
Creatinine, Ser: 0.65 mg/dL (ref 0.44–1.00)
GFR calc Af Amer: >60 mL/min (ref >60)
GFR calc non Af Amer: >60 mL/min (ref >60)
Glucose, Bld: 97 mg/dL (ref 70–99)
Potassium: 3.4 mmol/L — ABNORMAL LOW (ref 3.5–5.1)
Sodium: 139 mmol/L (ref 135–145)

## 2019-12-13 NOTE — Progress Notes (Addendum)
Upon entering room, pt's c-collar was off. Pt educated on need to wear collar and why and it was placed back on. At 0930, RN again entered room to find collar off of pt. Collar placed back on again. Since that time, collar has remained on pt.   1615: Pt again found with collar off and was educated. Pt said "it was strangling me so I took it off and have kept my head really still." Pt refuses to place c-collar back on.  Robina Ade, RN
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
## 2019-12-13 NOTE — Progress Notes (Addendum)
Patient call light on to get off of bedpan. Introduced myself as her Corporate treasurer. Patient accepted.   2006- Patient c/o being hot, turned fan on. 2010- Patient wants scds back on, assisted by other nurse. 2018- Patient requests for night time meds so she can go to sleep. 2100- Pushes call light for night medicine and warm blanket. 2105- Patient wants fan off in room 2112- Patient in bed, night time meds given. Patient appears to have anxiety. Instructed patient to allow me to do things for her while I am in the room, therefore she would not have to wait when I am helping another patient. Patient asks for pain meds, patient is reminded of when pain meds were last given. Patient says "so you will wake me up for my pain medicine?' This nurse says if you are woken up from your pain to let me know and she can have her pain medicine for it is order as needed, but must follow order of how often it can be given. Patient verbalizes understanding.  2317- Patient resting at this time.  0006-calls out for pain  Meds. Midnight meds also given at this time. 0106- Patient resting at this time.  0300- Requests for pain meds. When I get to the room she wants another blanket, and to help her turn. Vitals also taken at this time. Education about pain meds also given at this time. I have spent 30 minutes with this patient, attending to her needs.  0400- Requires x2 staff to place on bedpan. Sheets changed. 1610- Patient continue to c/o pain. Scheduled meds given along with prn. Patient asks when can she have her next pain med? Patient is not due for any pain medicine any further on this shift. Alternative measures offered. Patient refuses.
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
## 2019-12-14 NOTE — Progress Notes (Signed)
Patient was agitated regarding pain medication and available dosage. I explained in depth that her medication can only be given at  loted times and she had exhasusted her available pain medications and she is not open to a dose until 1830. Patient asked if she could have a wheel chair to leave and I told her that would not be in her best interest. She asked what would happen if she did I explained I would call security. She settled down after our conversation.

## 2019-12-15 DIAGNOSIS — S3729XA Other injury of bladder, initial encounter: Secondary | ICD-10-CM

## 2019-12-15 DIAGNOSIS — S329XXA Fracture of unspecified parts of lumbosacral spine and pelvis, initial encounter for closed fracture: Secondary | ICD-10-CM

## 2019-12-15 DIAGNOSIS — S270XXA Traumatic pneumothorax, initial encounter: Secondary | ICD-10-CM

## 2019-12-15 DIAGNOSIS — S022XXA Fracture of nasal bones, initial encounter for closed fracture: Secondary | ICD-10-CM

## 2019-12-15 DIAGNOSIS — S37009A Unspecified injury of unspecified kidney, initial encounter: Secondary | ICD-10-CM

## 2019-12-15 DIAGNOSIS — S36113A Laceration of liver, unspecified degree, initial encounter: Secondary | ICD-10-CM

## 2019-12-15 LAB — CBC
HCT: 26.6 % — ABNORMAL LOW (ref 36.0–46.0)
Hemoglobin: 8.9 g/dL — ABNORMAL LOW (ref 12.0–15.0)
MCH: 29.2 pg (ref 26.0–34.0)
MCHC: 33.5 g/dL (ref 30.0–36.0)
MCV: 87.2 fL (ref 80.0–100.0)
Platelets: 434 10*3/uL — ABNORMAL HIGH (ref 150–400)
RBC: 3.05 MIL/uL — ABNORMAL LOW (ref 3.87–5.11)
RDW: 15.2 % (ref 11.5–15.5)
WBC: 14.6 10*3/uL — ABNORMAL HIGH (ref 4.0–10.5)
nRBC: 0.1 % (ref 0.0–0.2)

## 2019-12-15 LAB — BASIC METABOLIC PANEL
Anion gap: 10 (ref 5–15)
BUN: 8 mg/dL (ref 6–20)
CO2: 22 mmol/L (ref 22–32)
Calcium: 7.9 mg/dL — ABNORMAL LOW (ref 8.9–10.3)
Chloride: 109 mmol/L (ref 98–111)
Creatinine, Ser: 0.51 mg/dL (ref 0.44–1.00)
GFR calc Af Amer: >60 mL/min (ref >60)
GFR calc non Af Amer: >60 mL/min (ref >60)
Glucose, Bld: 112 mg/dL — ABNORMAL HIGH (ref 70–99)
Potassium: 2.9 mmol/L — ABNORMAL LOW (ref 3.5–5.1)
Sodium: 141 mmol/L (ref 135–145)

## 2019-12-15 MED ORDER — METHOCARBAMOL 500 MG PO TABS
1000.0000 mg | ORAL_TABLET | Freq: Four times a day (QID) | ORAL | Status: DC
  Administered 2019-12-15 – 2019-12-18 (×13): 1000 mg via ORAL
  Filled 2019-12-15 (×13): qty 2

## 2019-12-15 MED ORDER — POTASSIUM CHLORIDE 20 MEQ/15ML (10%) PO SOLN
40.0000 meq | ORAL | Status: AC
  Administered 2019-12-15 (×3): 40 meq via ORAL
  Filled 2019-12-15 (×3): qty 30

## 2019-12-15 MED ORDER — POLYETHYLENE GLYCOL 3350 17 G PO PACK: 17.0000 g | PACK | Freq: Every day | ORAL | Status: DC | PRN

## 2019-12-15 MED ORDER — LORAZEPAM 2 MG/ML IJ SOLN
0.5000 mg | INTRAMUSCULAR | Status: DC | PRN
  Administered 2019-12-15 – 2019-12-16 (×2): 0.5 mg via INTRAVENOUS
  Filled 2019-12-15: qty 1

## 2019-12-15 MED ORDER — KETOROLAC TROMETHAMINE 15 MG/ML IJ SOLN
30.0000 mg | Freq: Four times a day (QID) | INTRAMUSCULAR | Status: DC
  Administered 2019-12-15 – 2019-12-18 (×12): 30 mg via INTRAVENOUS
  Filled 2019-12-15 (×13): qty 2

## 2019-12-15 MED ORDER — GABAPENTIN 300 MG PO CAPS
600.0000 mg | ORAL_CAPSULE | Freq: Three times a day (TID) | ORAL | Status: DC
  Administered 2019-12-15 – 2019-12-18 (×9): 600 mg via ORAL
  Filled 2019-12-15 (×10): qty 2

## 2019-12-15 MED ORDER — HYDROMORPHONE HCL 1 MG/ML IJ SOLN: 0.5000 mg | INTRAMUSCULAR | Status: DC | PRN
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
## 2019-12-15 NOTE — Progress Notes (Signed)
Inpatient Rehab Admissions Coordinator:   Left message for pt's sister, Efraim Kaufmann, to discuss CIR and dispo.  Will await return call.   Estill Dooms, PT, DPT Admissions Coordinator [phone] 12/15/19  1:04 PM
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
## 2019-12-16 LAB — CBC
HCT: 27.8 % — ABNORMAL LOW (ref 36.0–46.0)
Hemoglobin: 9 g/dL — ABNORMAL LOW (ref 12.0–15.0)
MCH: 29.2 pg (ref 26.0–34.0)
MCHC: 32.4 g/dL (ref 30.0–36.0)
MCV: 90.3 fL (ref 80.0–100.0)
Platelets: 555 10*3/uL — ABNORMAL HIGH (ref 150–400)
RBC: 3.08 MIL/uL — ABNORMAL LOW (ref 3.87–5.11)
RDW: 15.9 % — ABNORMAL HIGH (ref 11.5–15.5)
WBC: 13.6 10*3/uL — ABNORMAL HIGH (ref 4.0–10.5)
nRBC: 0.2 % (ref 0.0–0.2)

## 2019-12-16 LAB — BASIC METABOLIC PANEL
Anion gap: 12 (ref 5–15)
BUN: 12 mg/dL (ref 6–20)
CO2: 20 mmol/L — ABNORMAL LOW (ref 22–32)
Calcium: 8.3 mg/dL — ABNORMAL LOW (ref 8.9–10.3)
Chloride: 109 mmol/L (ref 98–111)
Creatinine, Ser: 0.51 mg/dL (ref 0.44–1.00)
GFR calc Af Amer: >60 mL/min (ref >60)
GFR calc non Af Amer: >60 mL/min (ref >60)
Glucose, Bld: 107 mg/dL — ABNORMAL HIGH (ref 70–99)
Potassium: 3.8 mmol/L (ref 3.5–5.1)
Sodium: 141 mmol/L (ref 135–145)

## 2019-12-16 MED ORDER — OXYCODONE HCL ER 10 MG PO T12A: 20.0000 mg | EXTENDED_RELEASE_TABLET | Freq: Two times a day (BID) | ORAL | Status: DC

## 2019-12-16 MED ORDER — HYDROMORPHONE HCL 1 MG/ML IJ SOLN
0.5000 mg | Freq: Three times a day (TID) | INTRAMUSCULAR | Status: DC | PRN
  Administered 2019-12-17: 0.5 mg via INTRAVENOUS
  Filled 2019-12-16: qty 1

## 2019-12-16 MED ORDER — ENSURE ENLIVE PO LIQD
237.0000 mL | Freq: Two times a day (BID) | ORAL | Status: DC
  Administered 2019-12-16 – 2019-12-18 (×2): 237 mL via ORAL

## 2019-12-16 MED ORDER — OXYCODONE HCL ER 10 MG PO T12A
20.0000 mg | EXTENDED_RELEASE_TABLET | Freq: Two times a day (BID) | ORAL | Status: DC
  Administered 2019-12-16 – 2019-12-18 (×5): 20 mg via ORAL
  Filled 2019-12-16 (×5): qty 2

## 2019-12-16 NOTE — Progress Notes (Signed)
Patient c/o of 9-10/10 pain, q4 hours, scheduled medications and PRN medications given. Immediately after medication administration patient would become drowsy. During pain re-assessment patient would be asleep, snoring and would only awaken around q4 hours due to having a bowel movement or scheduled medications/interventions. No PRN IV pain medication given due to pain re-assessment findings.
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
## 2019-12-16 NOTE — Progress Notes (Signed)
Inpatient Rehab Admissions Coordinator:   Attempted to reach pt's sister, Efraim Kaufmann.  No answer, left voicemail.  Will continue efforts.  Estill Dooms, PT, DPT Admissions Coordinator [phone] 12/16/19  1:46 PM

## 2019-12-16 NOTE — Plan of Care (Signed)
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
## 2019-12-16 NOTE — Progress Notes (Signed)
Pt stated her sister is not being agreeable to helping her post CIR. Pt Grandmother agreed to assist patient with any needs after her therapy is completed with CIR.  Grandmother name is Windell Norfolk and can be reached at   [employer] [phone] ( cell) Celine Ahr Marisue Ivan)  [employer] [phone] ( cell)  Celine Ahr Shanda Bumps)  DO NOT CALL AFTER 3PM BECAUSE AUNT WILL BE AT WORK  The number above belong to the grandmother's sister as the grandmother does not have a cell phone, but they all live in the same household.
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
## 2019-12-17 LAB — BASIC METABOLIC PANEL
Anion gap: 12 (ref 5–15)
BUN: 12 mg/dL (ref 6–20)
CO2: 22 mmol/L (ref 22–32)
Calcium: 8.7 mg/dL — ABNORMAL LOW (ref 8.9–10.3)
Chloride: 107 mmol/L (ref 98–111)
Creatinine, Ser: 0.56 mg/dL (ref 0.44–1.00)
GFR calc Af Amer: >60 mL/min (ref >60)
GFR calc non Af Amer: >60 mL/min (ref >60)
Glucose, Bld: 109 mg/dL — ABNORMAL HIGH (ref 70–99)
Potassium: 4.2 mmol/L (ref 3.5–5.1)
Sodium: 141 mmol/L (ref 135–145)

## 2019-12-17 LAB — CBC
HCT: 28.8 % — ABNORMAL LOW (ref 36.0–46.0)
Hemoglobin: 9.2 g/dL — ABNORMAL LOW (ref 12.0–15.0)
MCH: 28.2 pg (ref 26.0–34.0)
MCHC: 31.9 g/dL (ref 30.0–36.0)
MCV: 88.3 fL (ref 80.0–100.0)
Platelets: 708 10*3/uL — ABNORMAL HIGH (ref 150–400)
RBC: 3.26 MIL/uL — ABNORMAL LOW (ref 3.87–5.11)
RDW: 15.9 % — ABNORMAL HIGH (ref 11.5–15.5)
WBC: 16.8 10*3/uL — ABNORMAL HIGH (ref 4.0–10.5)
nRBC: 0.2 % (ref 0.0–0.2)
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
## 2019-12-17 NOTE — Progress Notes (Addendum)
Inpatient Rehab Admissions Coordinator:   Addendum: I was able to verify pt's [place] Medicaid (coverage code MAF), policy [id number] s and updated the pre-service center.   Spoke to pt's grandmother and another family member (both named [place]).  Both confirm that pt will have 24/7 assist at discharge from rehab.  Grandmother states pt should have Medicaid (I was not able to verify this last week, but will attempt to verify whether or not pt has Medicaid today).  Note d/c of IV pain meds today.  Will follow up tomorrow for potential admit pending bed availability and pain control.   Shann Medal, PT, DPT Admissions Coordinator [phone] 12/17/19  11:16 AM

## 2019-12-18 ENCOUNTER — Inpatient Hospital Stay (HOSPITAL_COMMUNITY): Payer: Medicaid Other

## 2019-12-18 LAB — BASIC METABOLIC PANEL
Anion gap: 10 (ref 5–15)
BUN: 12 mg/dL (ref 6–20)
CO2: 24 mmol/L (ref 22–32)
Calcium: 8.3 mg/dL — ABNORMAL LOW (ref 8.9–10.3)
Chloride: 108 mmol/L (ref 98–111)
Creatinine, Ser: 0.64 mg/dL (ref 0.44–1.00)
GFR calc Af Amer: >60 mL/min (ref >60)
GFR calc non Af Amer: >60 mL/min (ref >60)
Glucose, Bld: 111 mg/dL — ABNORMAL HIGH (ref 70–99)
Potassium: 3.6 mmol/L (ref 3.5–5.1)
Sodium: 142 mmol/L (ref 135–145)

## 2019-12-18 LAB — CBC
HCT: 27.7 % — ABNORMAL LOW (ref 36.0–46.0)
Hemoglobin: 8.9 g/dL — ABNORMAL LOW (ref 12.0–15.0)
MCH: 29.1 pg (ref 26.0–34.0)
MCHC: 32.1 g/dL (ref 30.0–36.0)
MCV: 90.5 fL (ref 80.0–100.0)
Platelets: 712 10*3/uL — ABNORMAL HIGH (ref 150–400)
RBC: 3.06 MIL/uL — ABNORMAL LOW (ref 3.87–5.11)
RDW: 16.2 % — ABNORMAL HIGH (ref 11.5–15.5)
WBC: 16.5 10*3/uL — ABNORMAL HIGH (ref 4.0–10.5)
nRBC: 0.2 % (ref 0.0–0.2)

## 2019-12-18 MED ORDER — DOCUSATE SODIUM 100 MG PO CAPS: 100.0000 mg | ORAL_CAPSULE | Freq: Two times a day (BID) | ORAL | 0 refills | Status: DC

## 2019-12-18 MED ORDER — POLYETHYLENE GLYCOL 3350 17 G PO PACK: 17.0000 g | PACK | Freq: Every day | ORAL | 0 refills | Status: DC | PRN

## 2019-12-18 MED ORDER — METHOCARBAMOL 500 MG PO TABS: 1000.0000 mg | ORAL_TABLET | Freq: Four times a day (QID) | ORAL | 0 refills | Status: DC | PRN

## 2019-12-18 MED ORDER — IOHEXOL 300 MG/ML  SOLN
100.0000 mL | Freq: Once | INTRAMUSCULAR | Status: AC | PRN
  Administered 2019-12-18: 100 mL via INTRAVENOUS

## 2019-12-18 MED ORDER — OXYCODONE HCL ER 20 MG PO T12A: 20.0000 mg | EXTENDED_RELEASE_TABLET | Freq: Two times a day (BID) | ORAL | 0 refills | Status: DC

## 2019-12-18 MED ORDER — VITAMIN D3 25 MCG PO TABS: 2000.0000 [IU] | ORAL_TABLET | Freq: Two times a day (BID) | ORAL | Status: AC

## 2019-12-18 MED ORDER — ACETAMINOPHEN 500 MG PO TABS: 1000.0000 mg | ORAL_TABLET | Freq: Three times a day (TID) | ORAL | 0 refills | Status: AC | PRN

## 2019-12-18 MED ORDER — OXYCODONE HCL 15 MG PO TABS: 15.0000 mg | ORAL_TABLET | Freq: Four times a day (QID) | ORAL | 0 refills | Status: AC | PRN

## 2019-12-18 MED ORDER — IOHEXOL 300 MG/ML  SOLN
50.0000 mL | Freq: Once | INTRAMUSCULAR | Status: AC | PRN
  Administered 2019-12-18: 50 mL

## 2019-12-18 NOTE — Progress Notes (Addendum)
Patient AOx4 set on leaving the hospital today as soon as possible. Patient expressed concern about her housing and being evicted if she does not address her bills immediately. RN relayed the information to MD who recommended the patient stay a little longer until her discharge prescriptions can be sorted and her foley potentially removed. Patient agreed and now anxiously waiting in bed. Will report situation to day RN.   [id number]: patient refusing tele monitoring
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
## 2019-12-18 NOTE — TOC Transition Note (Signed)
Transition of Care [facility]) - CM/SW Discharge Note   Patient Details  Name: Ashley Choi MRN: [id number] Date of Birth: 1990-10-17  [facility] CM/SW Contact:  Glennon Mac, RN Phone Number: 12/18/2019, [phone]  Clinical Narrative: Patient has decided to forego inpatient rehab and discharge home with family to provide assistance at discharge.  She will have 24-hour assistance at home.  Unable to secure home health therapies, as patient has Medicaid as primary payer; no home health agencies currently accepting Medicaid for home therapies.  Patient agreeable to outpatient therapy at [facility] main campus, which is near her home; her friend, at bedside, states she will be able to transport her to therapy appointments.  Referral to [employer] for all recommended DME, which will be delivered to bedside prior to discharge.    Final next level of care: OP Rehab Barriers to Discharge: Barriers Resolved        Choice offered to / list presented to : Patient                        Discharge Plan and Services   Discharge Planning Services: CM Consult Post Acute Care Choice: IP Rehab              Date DME Agency Contacted: 12/18/19 Time DME Agency Contacted: 1026 Representative spoke with at DME Agency: Oletha Cruel            Social Determinants of Health (SDOH) Interventions     Readmission Risk Interventions Readmission Risk Prevention Plan 12/18/2019  Post Dischage Appt Complete  Medication Screening Complete  Transportation Screening Complete   Quintella Baton, RN, BSN  Trauma/Neuro ICU Case Manager [phone]
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
## 2019-12-18 NOTE — Discharge Instructions (Signed)
WEAR YOUR C-COLLAR (NECK COLLAR) UNTIL EVALUATED BY NEUROSURGERY.  CONTINUE WEIGHT-BEARING RESTRICTIONS FOR YOUR LOWER EXTREMITIES --  Weightbearing: TOUCHDOWN WEIGHT BEARING ON YOUR RIGHT LOWER EXTREMITY, WEIGHT-BEARING FOR TRANSFERS ONLY ON LEFT LOWER EXTREMITY.  Liver Laceration  A liver laceration is a tear or a cut in the liver. The liver is an organ that is involved in many important bodily functions. Sometimes, a liver laceration can be a very serious injury. It can cause a lot of bleeding, and surgery may be needed. Other times, a liver laceration may be minor, and bed rest may be all that is needed. Either way, treatment in a hospital is almost always required. Liver lacerations are categorized in grades from 1 to 5. Low numbers identify lacerations that are less severe than lacerations with high numbers.  Grade 1: This is a tear in the outer lining of the liver. It is less than  inch (1 cm) deep.  Grade 2: This is a tear that is about  inch to 1 inch (1 to 3 cm) deep. It is less than 4 inches (10 cm) long.  Grade 3: This is a tear that is slightly more than 1 inch (3 cm) deep.  Grades 4 and 5: These lacerations are very deep. They affect a large part of the liver. What are the causes? This condition may be caused by:  A forceful hit to the area around the liver (blunt trauma), such as in a car crash. Blunt trauma can tear the liver even though it does not break the skin.  An injury in which an object goes through the skin and into the liver (penetrating injury), such as a stab or gunshot wound. What are the signs or symptoms? Common symptoms of this condition include:  A swollen and firm abdomen.  Pain in the abdomen.  Tenderness when pressing on the right side of the abdomen. Other symptoms include:  Bleeding from a penetrating wound.  Bruises on the abdomen.  A fast heartbeat.  Taking quick breaths.  Feeling weak and dizzy. How is this diagnosed? To diagnose  this condition, your health care provider will do a physical exam and ask about any injuries to the right side of your abdomen. You may have various tests, such as:  Blood tests. Your blood may be tested every few hours. This will show whether you are losing blood.  CT scan. This test is done to check for laceration or bleeding.  Laparoscopy. This involves placing a small camera into the abdomen and looking directly at the surface of the liver. How is this treated? Treatment depends on how deep the laceration is and how much bleeding you have. Treatment options include:  Monitoring and bed rest at the hospital. You will have tests often.  Receiving donated blood through an IV tube (transfusion) to replace blood that you have lost. You may need several transfusions.  Surgery to pack gauze pads or special material around the laceration to help it heal or to repair the laceration. Follow these instructions at home:  Take over-the-counter and prescription medicines only as told by your health care provider. Do not take any other medicines unless you ask your health care provider about them first.  Do not drive or use heavy machinery while taking prescription pain medicines.  Rest and limit your activity as told by your health care provider. It may be several months before you can return to your usual routine. Do not participate in activities that involve physical contact or  require extra energy until your health care provider approves.  Keep all follow-up visits as told by your health care provider. This is important. Contact a health care provider if:  Your abdominal pain does not go away.  You feel more weak and tired than usual. Get help right away if:  Your abdominal pain gets worse.  You have a cut on your skin that: ? Has more redness, swelling, or pain around it. ? Has more fluid or blood coming from it. ? Feels warm to the touch. ? Has pus or a bad smell coming from  it.  You feel dizzy or very weak.  You have trouble breathing.  You have a fever. This information is not intended to replace advice given to you by your health care provider. Make sure you discuss any questions you have with your health care provider. Document Revised: 04/06/2017 Document Reviewed: 12/10/2015 Elsevier Patient Education  2020 [employer]  Splenic Injury  A splenic injury is an injury of the spleen. The spleen is an organ located in the upper left area of the abdomen, just under the ribs. The spleen filters and cleans the blood. It also stores blood cells and destroys cells that are worn out. The spleen also plays an important role in fighting disease. Splenic injuries can vary. In some cases, the spleen may only be bruised, with some bleeding inside the covering and around the spleen. Splenic injuries may also cause a deep tear or cut into the spleen (lacerated spleen). Some splenic injuries can cause the spleen to break open (rupture). What are the causes? This condition may be caused by a direct blow (trauma) to the spleen. Trauma can result from:  Car accidents.  Contact sports.  Falls.  Penetrating injuries. These can be caused by gunshot wounds or sharp objects such as a knife. What increases the risk? You may be at greater risk for a splenic injury if you have a disease that can cause the spleen to become enlarged. These include:  Alcoholic liver disease.  Viral infections, especially mononucleosis.  Certain inflammatory diseases, such as lupus.  Certain cancers, especially those that involve the lymphatic system.  Cystic fibrosis. What are the signs or symptoms? Symptoms of this condition depend on the severity of the injury. A minor injury often causes no symptoms or only minor pain in the abdomen. A major injury can result in severe bleeding, causing your blood pressure to decrease rapidly. This in turn will cause symptoms such as:  Dizziness or  light-headedness.  Rapid heart rate.  Difficulty breathing.  Fainting.  Sweating with clammy skin. Other symptoms of a splenic injury may include:  Very bad abdominal pain.  Pain in the left shoulder.  Pain when the abdomen is pressed (tenderness).  Nausea.  Swelling or bruising of the abdomen. How is this diagnosed? This condition may be diagnosed based on:  Your symptoms and medical history, especially if you were recently in an accident or you recently got hurt.  A physical exam.  Imaging tests, such as: ? CT scan. ? Ultrasound. You may have frequent blood tests for a few days after the injury to monitor your condition. How is this treated? Treatment depends on the type of splenic injury you have and how bad it is.  Less severe injuries may be treated with: ? Observation. ? Interventional radiology. This involves using flexible tubes (catheters) to stop the bleeding from inside the blood vessel.  More severe injuries may require hospitalization in the  intensive care unit (ICU). While you are in the hospital: ? Your fluid and blood levels will be monitored closely. ? You will get fluids through an IV as needed. ? You may need follow-up scans to check whether your spleen is able to heal itself. If the injury is getting worse, you may need surgery. ? You may receive donated blood (transfusion). ? You may have a long needle inserted into your abdomen to remove any blood that has collected inside the spleen (hematoma).  If your blood pressure is too low, you may need emergency surgery. This may include: ? Repairing a laceration. ? Removing part of the spleen. ? Removing the entire spleen (splenectomy). Follow these instructions at home: Activity  Rest as told by your health care provider.  Avoid sitting for a long time without moving. Get up to take short walks every 1-2 hours. This is important to improve blood flow and breathing. Ask for help if you feel weak or  unsteady.  Do not participate in any activity that takes a lot of effort until your health care provider says that it is safe.  Do not take part in contact sports until your health care provider says it is safe to do so.  Do not lift anything that is heavier than 10 lb (4.5 kg), or the limit that you are told, until your health care provider says that it is safe. General instructions  Take over-the-counter and prescription medicines only as told by your health care provider.  Stay up-to-date on vaccines as told by your health care provider.  Follow instructions from your health care provider about eating or drinking restrictions.  Do not drink alcohol.  Do not use any products that contain nicotine or tobacco, such as cigarettes and e-cigarettes. These may delay healing after an injury. If you need help quitting, ask your health care provider.  Keep all follow-up visits as told by your health care provider. This is important. Get help right away if you have:  A fever.  New or increasing pain in your abdomen or in your left shoulder.  Signs or symptoms of internal bleeding. Watch for: ? Sweating. ? Dizziness. ? Weakness. ? Cold and clammy skin. ? Fainting.  Chest pain or difficulty breathing. Summary  The spleen is an organ located in the upper left area of the abdomen, just under the ribs.  A splenic injury is an injury of the spleen. This may be caused by a direct blow (trauma) to the spleen.  You may be at greater risk for a splenic injury if you have a disease that can cause the spleen to become enlarged.  A minor injury often causes no symptoms or only minor pain in the abdomen. A major injury can result in severe bleeding, causing your blood pressure to decrease rapidly.  Treatment depends on the type of splenic injury you have and how bad it is. This information is not intended to replace advice given to you by your health care provider. Make sure you discuss any  questions you have with your health care provider. Document Revised: 07/06/2017 Document Reviewed: 04/18/2017 Elsevier Patient Education  2020 [employer].

## 2019-12-18 NOTE — Progress Notes (Signed)
Pt discharged to home with home health. Per nightshift RN, Meda Klinefelter, pt became anxious to leave this am and was prepared to leave AMA if she was not discharged today due to personal things she needs to take care of. The PA was in agreement to discharge the pt. Pt's foley removed at around 0900 this am, and pt did void multiple times before leaving. Pt's midline IV removed and personal and room belongings packed up. Pt educated on all discharge information and had no further questions. DME supplies brought to room and pt escorted out to private vehicle in wheelchair.   Robina Ade, RN
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
## 2020-01-14 ENCOUNTER — Encounter (HOSPITAL_COMMUNITY): Payer: Self-pay

## 2020-01-26 ENCOUNTER — Encounter: Payer: Self-pay | Admitting: Physical Therapy

## 2020-01-26 ENCOUNTER — Ambulatory Visit: Payer: Medicaid Other | Attending: Family Medicine | Admitting: Physical Therapy

## 2020-01-26 ENCOUNTER — Other Ambulatory Visit: Payer: Self-pay

## 2020-01-26 DIAGNOSIS — M6281 Muscle weakness (generalized): Secondary | ICD-10-CM | POA: Diagnosis present

## 2020-01-26 DIAGNOSIS — G478 Other sleep disorders: Secondary | ICD-10-CM | POA: Insufficient documentation

## 2020-01-26 DIAGNOSIS — R102 Pelvic and perineal pain: Secondary | ICD-10-CM | POA: Diagnosis not present
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
## 2020-01-27 NOTE — Addendum Note (Signed)
Addended by: Ezekiel Ina on: 01/27/2020 08:32 AM   Modules accepted: Orders

## 2020-01-28 ENCOUNTER — Ambulatory Visit: Payer: Medicaid Other | Admitting: Occupational Therapy

## 2020-02-02 ENCOUNTER — Ambulatory Visit: Payer: Medicaid Other | Admitting: Physical Therapy

## 2020-02-02 ENCOUNTER — Ambulatory Visit: Payer: Medicaid Other | Admitting: Occupational Therapy

## 2020-02-04 ENCOUNTER — Ambulatory Visit: Payer: Medicaid Other | Admitting: Physical Therapy

## 2020-02-04 ENCOUNTER — Encounter: Payer: Self-pay | Admitting: Occupational Therapy

## 2020-02-09 ENCOUNTER — Ambulatory Visit: Payer: Self-pay | Attending: Family Medicine | Admitting: Physical Therapy

## 2020-02-09 ENCOUNTER — Ambulatory Visit: Payer: Self-pay | Admitting: Occupational Therapy

## 2020-02-12 ENCOUNTER — Ambulatory Visit: Payer: Self-pay | Admitting: Physical Therapy

## 2020-02-16 ENCOUNTER — Ambulatory Visit: Payer: Self-pay | Admitting: Occupational Therapy

## 2020-02-19 ENCOUNTER — Encounter: Payer: Self-pay | Admitting: Occupational Therapy

## 2020-02-19 ENCOUNTER — Ambulatory Visit: Payer: Self-pay | Admitting: Physical Therapy

## 2020-02-23 ENCOUNTER — Encounter: Payer: Self-pay | Admitting: Occupational Therapy

## 2020-02-23 ENCOUNTER — Ambulatory Visit: Payer: Self-pay | Admitting: Physical Therapy

## 2020-02-26 ENCOUNTER — Ambulatory Visit: Payer: Self-pay | Admitting: Physical Therapy

## 2020-03-01 ENCOUNTER — Ambulatory Visit: Payer: Self-pay | Admitting: Physical Therapy

## 2020-03-01 ENCOUNTER — Encounter: Payer: Self-pay | Admitting: Occupational Therapy

## 2020-03-04 ENCOUNTER — Ambulatory Visit: Payer: Self-pay | Admitting: Physical Therapy

## 2020-04-07 ENCOUNTER — Other Ambulatory Visit: Payer: Self-pay | Admitting: Student

## 2020-04-07 DIAGNOSIS — S32811D Multiple fractures of pelvis with unstable disruption of pelvic ring, subsequent encounter for fracture with routine healing: Secondary | ICD-10-CM

## 2020-04-23 ENCOUNTER — Other Ambulatory Visit: Payer: Self-pay

## 2020-04-23 ENCOUNTER — Inpatient Hospital Stay: Admission: RE | Admit: 2020-04-23 | Payer: Self-pay | Source: Ambulatory Visit

## 2020-04-23 ENCOUNTER — Ambulatory Visit
Admission: RE | Admit: 2020-04-23 | Discharge: 2020-04-23 | Disposition: A | Discharge status: Home / Self Care | Payer: Medicaid Other | Source: Ambulatory Visit | Attending: Student | Admitting: Student

## 2020-04-23 DIAGNOSIS — S32811D Multiple fractures of pelvis with unstable disruption of pelvic ring, subsequent encounter for fracture with routine healing: Secondary | ICD-10-CM

## 2020-10-28 ENCOUNTER — Other Ambulatory Visit: Payer: Self-pay

## 2020-10-28 ENCOUNTER — Encounter (HOSPITAL_COMMUNITY): Payer: Self-pay | Admitting: Student

## 2020-10-28 NOTE — Progress Notes (Addendum)
Ashley Choi did not know she was surgery tomorrow, she thought it was next week. Patient is stressed about surgery.Ashley Choi denies chest pain or shortness of breath. Patient denies any s/s of Covid in her home and is unaware of any exposures.
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
## 2020-10-29 ENCOUNTER — Ambulatory Visit (HOSPITAL_COMMUNITY): Payer: Self-pay | Admitting: Anesthesiology

## 2020-10-29 ENCOUNTER — Ambulatory Visit (HOSPITAL_COMMUNITY)
Admission: RE | Admit: 2020-10-29 | Discharge: 2020-10-29 | Disposition: A | Discharge status: Home / Self Care | Payer: Self-pay | Attending: Student | Admitting: Student

## 2020-10-29 ENCOUNTER — Encounter (HOSPITAL_COMMUNITY): Payer: Self-pay | Admitting: Student

## 2020-10-29 ENCOUNTER — Encounter (HOSPITAL_COMMUNITY)
Admission: RE | Disposition: A | Discharge status: Home / Self Care | Payer: Self-pay | Source: Home / Self Care | Attending: Student

## 2020-10-29 ENCOUNTER — Ambulatory Visit (HOSPITAL_COMMUNITY): Payer: Self-pay

## 2020-10-29 DIAGNOSIS — R102 Pelvic and perineal pain: Secondary | ICD-10-CM | POA: Insufficient documentation

## 2020-10-29 DIAGNOSIS — T8484XA Pain due to internal orthopedic prosthetic devices, implants and grafts, initial encounter: Secondary | ICD-10-CM | POA: Insufficient documentation

## 2020-10-29 DIAGNOSIS — G8929 Other chronic pain: Secondary | ICD-10-CM | POA: Insufficient documentation

## 2020-10-29 DIAGNOSIS — F17210 Nicotine dependence, cigarettes, uncomplicated: Secondary | ICD-10-CM | POA: Insufficient documentation

## 2020-10-29 DIAGNOSIS — Z419 Encounter for procedure for purposes other than remedying health state, unspecified: Secondary | ICD-10-CM

## 2020-10-29 DIAGNOSIS — Y831 Surgical operation with implant of artificial internal device as the cause of abnormal reaction of the patient, or of later complication, without mention of misadventure at the time of the procedure: Secondary | ICD-10-CM | POA: Insufficient documentation

## 2020-10-29 HISTORY — PX: HARDWARE REMOVAL: SHX979

## 2020-10-29 HISTORY — DX: Drug induced constipation: K59.03

## 2020-10-29 HISTORY — DX: Unspecified osteoarthritis, unspecified site: M19.90

## 2020-10-29 LAB — CBC
HCT: 37 % (ref 36.0–46.0)
Hemoglobin: 12.4 g/dL (ref 12.0–15.0)
MCH: 29.2 pg (ref 26.0–34.0)
MCHC: 33.5 g/dL (ref 30.0–36.0)
MCV: 87.3 fL (ref 80.0–100.0)
Platelets: 433 10*3/uL — ABNORMAL HIGH (ref 150–400)
RBC: 4.24 MIL/uL (ref 3.87–5.11)
RDW: 13.8 % (ref 11.5–15.5)
WBC: 8.1 10*3/uL (ref 4.0–10.5)
nRBC: 0 % (ref 0.0–0.2)

## 2020-10-29 LAB — BASIC METABOLIC PANEL
Anion gap: 8 (ref 5–15)
BUN: 6 mg/dL (ref 6–20)
CO2: 25 mmol/L (ref 22–32)
Calcium: 8.8 mg/dL — ABNORMAL LOW (ref 8.9–10.3)
Chloride: 104 mmol/L (ref 98–111)
Creatinine, Ser: 0.69 mg/dL (ref 0.44–1.00)
GFR, Estimated: >60 mL/min (ref >60)
Glucose, Bld: 106 mg/dL — ABNORMAL HIGH (ref 70–99)
Potassium: 3.2 mmol/L — ABNORMAL LOW (ref 3.5–5.1)
Sodium: 137 mmol/L (ref 135–145)

## 2020-10-29 LAB — POCT PREGNANCY, URINE: Preg Test, Ur: NEGATIVE

## 2020-10-29 SURGERY — REMOVAL, HARDWARE
Anesthesia: General | Site: Pelvis | Laterality: Right

## 2020-10-29 MED ORDER — OXYCODONE HCL 5 MG PO TABS: 5.0000 mg | ORAL_TABLET | Freq: Once | ORAL | Status: DC | PRN

## 2020-10-29 MED ORDER — ONDANSETRON HCL 4 MG/2ML IJ SOLN
INTRAMUSCULAR | Status: DC | PRN
  Administered 2020-10-29: 4 mg via INTRAVENOUS

## 2020-10-29 MED ORDER — POVIDONE-IODINE 10 % EX SWAB: 2.0000 "application " | Freq: Once | CUTANEOUS | Status: DC

## 2020-10-29 MED ORDER — NICOTINE 7 MG/24HR TD PT24: 7.0000 mg | MEDICATED_PATCH | Freq: Every day | TRANSDERMAL | Status: DC

## 2020-10-29 MED ORDER — HYDROMORPHONE HCL 1 MG/ML IJ SOLN
INTRAMUSCULAR | Status: AC
  Filled 2020-10-29: qty 1

## 2020-10-29 MED ORDER — HYDROMORPHONE HCL 1 MG/ML IJ SOLN
0.2500 mg | INTRAMUSCULAR | Status: DC | PRN
  Administered 2020-10-29 (×3): 0.5 mg via INTRAVENOUS

## 2020-10-29 MED ORDER — MIDAZOLAM HCL 5 MG/5ML IJ SOLN
INTRAMUSCULAR | Status: DC | PRN
  Administered 2020-10-29: 2 mg via INTRAVENOUS

## 2020-10-29 MED ORDER — FENTANYL CITRATE (PF) 250 MCG/5ML IJ SOLN
INTRAMUSCULAR | Status: AC
  Filled 2020-10-29: qty 5

## 2020-10-29 MED ORDER — PROMETHAZINE HCL 25 MG/ML IJ SOLN
6.2500 mg | INTRAMUSCULAR | Status: DC | PRN
Start: 2020-10-29 — End: 2020-10-29

## 2020-10-29 MED ORDER — CEFAZOLIN SODIUM-DEXTROSE 2-4 GM/100ML-% IV SOLN
2.0000 g | INTRAVENOUS | Status: AC
  Administered 2020-10-29: 2 g via INTRAVENOUS

## 2020-10-29 MED ORDER — LACTATED RINGERS IV SOLN: INTRAVENOUS | Status: DC

## 2020-10-29 MED ORDER — LIDOCAINE 2% (20 MG/ML) 5 ML SYRINGE
INTRAMUSCULAR | Status: AC
  Filled 2020-10-29: qty 5

## 2020-10-29 MED ORDER — ONDANSETRON HCL 4 MG/2ML IJ SOLN
INTRAMUSCULAR | Status: AC
  Filled 2020-10-29: qty 2

## 2020-10-29 MED ORDER — HYDROCODONE-ACETAMINOPHEN 5-325 MG PO TABS: 1.0000 | ORAL_TABLET | Freq: Four times a day (QID) | ORAL | 0 refills | Status: AC | PRN

## 2020-10-29 MED ORDER — 0.9 % SODIUM CHLORIDE (POUR BTL) OPTIME
TOPICAL | Status: DC | PRN
  Administered 2020-10-29: 1000 mL

## 2020-10-29 MED ORDER — OXYCODONE HCL 5 MG/5ML PO SOLN: 5.0000 mg | Freq: Once | ORAL | Status: DC | PRN

## 2020-10-29 MED ORDER — PROPOFOL 10 MG/ML IV BOLUS
INTRAVENOUS | Status: AC
  Filled 2020-10-29: qty 40

## 2020-10-29 MED ORDER — CHLORHEXIDINE GLUCONATE 4 % EX LIQD: 60.0000 mL | Freq: Once | CUTANEOUS | Status: DC

## 2020-10-29 MED ORDER — FENTANYL CITRATE (PF) 100 MCG/2ML IJ SOLN
INTRAMUSCULAR | Status: DC | PRN
  Administered 2020-10-29: 50 ug via INTRAVENOUS
  Administered 2020-10-29: 100 ug via INTRAVENOUS

## 2020-10-29 MED ORDER — DEXAMETHASONE SODIUM PHOSPHATE 10 MG/ML IJ SOLN
INTRAMUSCULAR | Status: AC
  Filled 2020-10-29: qty 1

## 2020-10-29 MED ORDER — CHLORHEXIDINE GLUCONATE 0.12 % MT SOLN
OROMUCOSAL | Status: AC
  Administered 2020-10-29: 15 mL via OROMUCOSAL
  Filled 2020-10-29: qty 15

## 2020-10-29 MED ORDER — PHENYLEPHRINE HCL-NACL 10-0.9 MG/250ML-% IV SOLN
INTRAVENOUS | Status: AC
  Filled 2020-10-29: qty 250

## 2020-10-29 MED ORDER — DEXAMETHASONE SODIUM PHOSPHATE 10 MG/ML IJ SOLN
INTRAMUSCULAR | Status: DC | PRN
  Administered 2020-10-29: 10 mg via INTRAVENOUS

## 2020-10-29 MED ORDER — PROPOFOL 10 MG/ML IV BOLUS
INTRAVENOUS | Status: DC | PRN
  Administered 2020-10-29: 200 mg via INTRAVENOUS

## 2020-10-29 MED ORDER — KETAMINE HCL 50 MG/5ML IJ SOSY
PREFILLED_SYRINGE | INTRAMUSCULAR | Status: AC
  Filled 2020-10-29: qty 5

## 2020-10-29 MED ORDER — ORAL CARE MOUTH RINSE: 15.0000 mL | Freq: Once | OROMUCOSAL | Status: AC

## 2020-10-29 MED ORDER — KETAMINE HCL 10 MG/ML IJ SOLN
INTRAMUSCULAR | Status: DC | PRN
  Administered 2020-10-29: 30 mg via INTRAVENOUS

## 2020-10-29 MED ORDER — AMISULPRIDE (ANTIEMETIC) 5 MG/2ML IV SOLN
10.0000 mg | Freq: Once | INTRAVENOUS | Status: DC | PRN
Start: 2020-10-29 — End: 2020-10-29

## 2020-10-29 MED ORDER — CHLORHEXIDINE GLUCONATE 0.12 % MT SOLN: 15.0000 mL | Freq: Once | OROMUCOSAL | Status: AC

## 2020-10-29 MED ORDER — MIDAZOLAM HCL 2 MG/2ML IJ SOLN
INTRAMUSCULAR | Status: AC
  Filled 2020-10-29: qty 2

## 2020-10-29 MED ORDER — LIDOCAINE HCL (CARDIAC) PF 100 MG/5ML IV SOSY
PREFILLED_SYRINGE | INTRAVENOUS | Status: DC | PRN
  Administered 2020-10-29: 50 mg via INTRAVENOUS

## 2020-10-29 MED ORDER — MEPERIDINE HCL 25 MG/ML IJ SOLN: 6.2500 mg | INTRAMUSCULAR | Status: DC | PRN

## 2020-10-29 MED ORDER — CEFAZOLIN SODIUM-DEXTROSE 2-4 GM/100ML-% IV SOLN
INTRAVENOUS | Status: AC
  Filled 2020-10-29: qty 100

## 2020-10-29 SURGICAL SUPPLY — 64 items
BANDAGE ESMARK 6X9 LF (GAUZE/BANDAGES/DRESSINGS) IMPLANT
BNDG COHESIVE 6X5 TAN STRL LF (GAUZE/BANDAGES/DRESSINGS) IMPLANT
BNDG ELASTIC 4X5.8 VLCR STR LF (GAUZE/BANDAGES/DRESSINGS) IMPLANT
BNDG ELASTIC 6X5.8 VLCR STR LF (GAUZE/BANDAGES/DRESSINGS) IMPLANT
BNDG ESMARK 6X9 LF (GAUZE/BANDAGES/DRESSINGS)
BNDG GAUZE ELAST 4 BULKY (GAUZE/BANDAGES/DRESSINGS) IMPLANT
BRUSH SCRUB EZ PLAIN DRY (MISCELLANEOUS) IMPLANT
CHLORAPREP W/TINT 26 (MISCELLANEOUS) ×3 IMPLANT
CLEANER TIP ELECTROSURG 2X2 (MISCELLANEOUS) ×3 IMPLANT
CLOSURE WOUND 1/2 X4 (GAUZE/BANDAGES/DRESSINGS)
COVER SURGICAL LIGHT HANDLE (MISCELLANEOUS) ×3 IMPLANT
COVER WAND RF STERILE (DRAPES) IMPLANT
CUFF TOURN SGL QUICK 18X4 (TOURNIQUET CUFF) IMPLANT
CUFF TOURN SGL QUICK 24 (TOURNIQUET CUFF)
CUFF TOURN SGL QUICK 34 (TOURNIQUET CUFF)
CUFF TRNQT CYL 24X4X16.5-23 (TOURNIQUET CUFF) IMPLANT
CUFF TRNQT CYL 34X4.125X (TOURNIQUET CUFF) IMPLANT
DERMABOND ADVANCED (GAUZE/BANDAGES/DRESSINGS) ×2
DERMABOND ADVANCED .7 DNX12 (GAUZE/BANDAGES/DRESSINGS) ×1 IMPLANT
DRAPE C-ARM 42X72 X-RAY (DRAPES) ×3 IMPLANT
DRAPE C-ARMOR (DRAPES) ×3 IMPLANT
DRAPE U-SHAPE 47X51 STRL (DRAPES) ×3 IMPLANT
DRESSING MEPILEX FLEX 4X4 (GAUZE/BANDAGES/DRESSINGS) ×1 IMPLANT
DRSG ADAPTIC 3X8 NADH LF (GAUZE/BANDAGES/DRESSINGS) IMPLANT
DRSG MEPILEX FLEX 4X4 (GAUZE/BANDAGES/DRESSINGS) ×3
ELECT REM PT RETURN 9FT ADLT (ELECTROSURGICAL) ×3
ELECTRODE REM PT RTRN 9FT ADLT (ELECTROSURGICAL) ×1 IMPLANT
GAUZE SPONGE 4X4 12PLY STRL (GAUZE/BANDAGES/DRESSINGS) IMPLANT
GLOVE SURG ENC MOIS LTX SZ6.5 (GLOVE) ×9 IMPLANT
GLOVE SURG ENC MOIS LTX SZ7.5 (GLOVE) ×12 IMPLANT
GLOVE SURG UNDER POLY LF SZ6.5 (GLOVE) ×3 IMPLANT
GLOVE SURG UNDER POLY LF SZ7.5 (GLOVE) ×3 IMPLANT
GOWN STRL REUS W/ TWL LRG LVL3 (GOWN DISPOSABLE) ×2 IMPLANT
GOWN STRL REUS W/TWL LRG LVL3 (GOWN DISPOSABLE) ×4
KIT BASIN OR (CUSTOM PROCEDURE TRAY) ×3 IMPLANT
KIT TURNOVER KIT B (KITS) ×3 IMPLANT
MANIFOLD NEPTUNE II (INSTRUMENTS) ×3 IMPLANT
NEEDLE 22X1 1/2 (OR ONLY) (NEEDLE) IMPLANT
NS IRRIG 1000ML POUR BTL (IV SOLUTION) ×3 IMPLANT
PACK ORTHO EXTREMITY (CUSTOM PROCEDURE TRAY) ×3 IMPLANT
PAD ARMBOARD 7.5X6 YLW CONV (MISCELLANEOUS) ×6 IMPLANT
PADDING CAST COTTON 6X4 STRL (CAST SUPPLIES) IMPLANT
SPONGE LAP 18X18 RF (DISPOSABLE) IMPLANT
STAPLER VISISTAT 35W (STAPLE) IMPLANT
STOCKINETTE IMPERVIOUS LG (DRAPES) IMPLANT
STRIP CLOSURE SKIN 1/2X4 (GAUZE/BANDAGES/DRESSINGS) IMPLANT
SUCTION FRAZIER HANDLE 10FR (MISCELLANEOUS) ×2
SUCTION TUBE FRAZIER 10FR DISP (MISCELLANEOUS) ×1 IMPLANT
SUT ETHILON 3 0 PS 1 (SUTURE) IMPLANT
SUT MNCRL AB 3-0 PS2 18 (SUTURE) ×3 IMPLANT
SUT MON AB 2-0 CT1 36 (SUTURE) IMPLANT
SUT PDS AB 2-0 CT1 27 (SUTURE) IMPLANT
SUT VIC AB 0 CT1 27 (SUTURE)
SUT VIC AB 0 CT1 27XBRD ANBCTR (SUTURE) IMPLANT
SUT VIC AB 2-0 CT1 27 (SUTURE)
SUT VIC AB 2-0 CT1 TAPERPNT 27 (SUTURE) IMPLANT
SYR CONTROL 10ML LL (SYRINGE) IMPLANT
TOWEL GREEN STERILE (TOWEL DISPOSABLE) ×6 IMPLANT
TOWEL GREEN STERILE FF (TOWEL DISPOSABLE) ×6 IMPLANT
TUBE CONNECTING 12'X1/4 (SUCTIONS) ×1
TUBE CONNECTING 12X1/4 (SUCTIONS) ×2 IMPLANT
UNDERPAD 30X36 HEAVY ABSORB (UNDERPADS AND DIAPERS) ×3 IMPLANT
WATER STERILE IRR 1000ML POUR (IV SOLUTION) IMPLANT
YANKAUER SUCT BULB TIP NO VENT (SUCTIONS) ×3 IMPLANT

## 2020-10-29 NOTE — Discharge Instructions (Addendum)
Orthopaedic Trauma Service Discharge Instructions   General Discharge Instructions  WEIGHT BEARING STATUS: Weightbearing as tolerated  RANGE OF MOTION/ACTIVITY: Ok for unrestricted range of motion of both hips  Wound Care:You may remove your surgical dressing on Sunday 10/31/20.  Incisions can be left open to air if there is no drainage. If incision continues to have drainage, follow wound care instructions below. Okay to shower if no drainage from incisions.  DVT/PE prophylaxis: None  Diet: as you were eating previously.  Can use over the counter stool softeners and bowel preparations, such as Miralax, to help with bowel movements.  Narcotics can be constipating.  Be sure to drink plenty of fluids  PAIN MEDICATION USE AND EXPECTATIONS  You have likely been given narcotic medications to help control your pain.  After a traumatic event that results in an fracture (broken bone) with or without surgery, it is ok to use narcotic pain medications to help control one's pain.  We understand that everyone responds to pain differently and each individual patient will be evaluated on a regular basis for the continued need for narcotic medications. Ideally, narcotic medication use should last no more than 6-8 weeks (coinciding with fracture healing).   As a patient it is your responsibility as well to monitor narcotic medication use and report the amount and frequency you use these medications when you come to your office visit.   We would also advise that if you are using narcotic medications, you should take a dose prior to therapy to maximize you participation.  IF YOU ARE ON NARCOTIC MEDICATIONS IT IS NOT PERMISSIBLE TO OPERATE A MOTOR VEHICLE (MOTORCYCLE/CAR/TRUCK/MOPED) OR HEAVY MACHINERY DO NOT MIX NARCOTICS WITH OTHER CNS (CENTRAL NERVOUS SYSTEM) DEPRESSANTS SUCH AS ALCOHOL   STOP SMOKING OR USING NICOTINE PRODUCTS!!!!  As discussed nicotine severely impairs your body's ability to heal  surgical and traumatic wounds but also impairs bone healing.  Wounds and bone heal by forming microscopic blood vessels (angiogenesis) and nicotine is a vasoconstrictor (essentially, shrinks blood vessels).  Therefore, if vasoconstriction occurs to these microscopic blood vessels they essentially disappear and are unable to deliver necessary nutrients to the healing tissue.  This is one modifiable factor that you can do to dramatically increase your chances of healing your injury.    (This means no smoking, no nicotine gum, patches, etc)  DO NOT USE NONSTEROIDAL ANTI-INFLAMMATORY DRUGS (NSAID'S)  Using products such as Advil (ibuprofen), Aleve (naproxen), Motrin (ibuprofen) for additional pain control during fracture healing can delay and/or prevent the healing response.  If you would like to take over the counter (OTC) medication, Tylenol (acetaminophen) is ok.  However, some narcotic medications that are given for pain control contain acetaminophen as well. Therefore, you should not exceed more than 4000 mg of tylenol in a day if you do not have liver disease.  Also note that there are may OTC medicines, such as cold medicines and allergy medicines that my contain tylenol as well.  If you have any questions about medications and/or interactions please ask your doctor/PA or your pharmacist.      ICE AND ELEVATE INJURED/OPERATIVE EXTREMITY  Using ice and elevating the injured extremity above your heart can help with swelling and pain control.  Icing in a pulsatile fashion, such as 20 minutes on and 20 minutes off, can be followed.    Do not place ice directly on skin. Make sure there is a barrier between to skin and the ice pack.    Using  frozen items such as frozen peas works well as the conform nicely to the are that needs to be iced.  USE AN ACE WRAP OR TED HOSE FOR SWELLING CONTROL  In addition to icing and elevation, Ace wraps or TED hose are used to help limit and resolve swelling.  It is  recommended to use Ace wraps or TED hose until you are informed to stop.    When using Ace Wraps start the wrapping distally (farthest away from the body) and wrap proximally (closer to the body)   Example: If you had surgery on your leg or thing and you do not have a splint on, start the ace wrap at the toes and work your way up to the thigh        If you had surgery on your upper extremity and do not have a splint on, start the ace wrap at your fingers and work your way up to the upper arm   CALL THE OFFICE WITH ANY QUESTIONS OR CONCERNS: [phone]   VISIT OUR WEBSITE FOR ADDITIONAL INFORMATION: [email or web address]    Post Anesthesia Home Care Instructions  Activity: Get plenty of rest for the remainder of the day. A responsible individual must stay with you for 24 hours following the procedure.  For the next 24 hours, DO NOT: -Drive a car -Advertising copywriter -Drink alcoholic beverages -Take any medication unless instructed by your physician -Make any legal decisions or sign important papers.  Meals: Start with liquid foods such as gelatin or soup. Progress to regular foods as tolerated. Avoid greasy, spicy, heavy foods. If nausea and/or vomiting occur, drink only clear liquids until the nausea and/or vomiting subsides. Call your physician if vomiting continues.  Special Instructions/Symptoms: Your throat may feel dry or sore from the anesthesia or the breathing tube placed in your throat during surgery. If this causes discomfort, gargle with warm salt water. The discomfort should disappear within 24 hours.  If you had a scopolamine patch placed behind your ear for the management of post- operative nausea and/or vomiting:  1. The medication in the patch is effective for 72 hours, after which it should be removed.  Wrap patch in a tissue and discard in the trash. Wash hands thoroughly with soap and water. 2. You may remove the patch earlier than 72 hours if you experience unpleasant  side effects which may include dry mouth, dizziness or visual disturbances. 3. Avoid touching the patch. Wash your hands with soap and water after contact with the patch.

## 2020-10-29 NOTE — Anesthesia Postprocedure Evaluation (Signed)
Anesthesia Post Note  Patient: Ashley Choi  Procedure(s) Performed: HARDWARE REMOVAL (Right: Pelvis)     Patient location during evaluation: PACU Anesthesia Type: General Level of consciousness: awake and alert Pain management: pain level controlled Vital Signs Assessment: post-procedure vital signs reviewed and stable Respiratory status: spontaneous breathing, nonlabored ventilation and respiratory function stable Cardiovascular status: blood pressure returned to baseline and stable Postop Assessment: no apparent nausea or vomiting Anesthetic complications: no   No notable events documented.  Last Vitals:  Vitals:   10/29/20 0930 10/29/20 0936  BP: (!) 138/97 (!) 128/94  Pulse: 60 76  Resp: 13 16  Temp:    SpO2: 100% 100%    Last Pain:  Vitals:   10/29/20 0936  TempSrc:   PainSc: 6                  Lowella Curb
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
## 2020-10-29 NOTE — Interval H&P Note (Signed)
History and Physical Interval Note:  10/29/2020 7:29 AM  Ashley Choi  has presented today for surgery, with the diagnosis of SYMPTOMATIC PELVIC HARDWARE.  The various methods of treatment have been discussed with the patient and family. After consideration of risks, benefits and other options for treatment, the patient has consented to  Procedure(s): HARDWARE REMOVAL (Right) as a surgical intervention.  The patient's history has been reviewed, patient examined, no change in status, stable for surgery.  I have reviewed the patient's chart and labs.  Questions were answered to the patient's satisfaction.     Caryn Bee P Siler Mavis

## 2020-10-29 NOTE — Anesthesia Procedure Notes (Signed)
Procedure Name: LMA Insertion Date/Time: 10/29/2020 8:07 AM Performed by: Shanon Payor, CRNA Pre-anesthesia Checklist: Patient identified, Patient being monitored, Emergency Drugs available, Timeout performed and Suction available Patient Re-evaluated:Patient Re-evaluated prior to induction Oxygen Delivery Method: Circle System Utilized Preoxygenation: Pre-oxygenation with 100% oxygen Induction Type: IV induction Ventilation: Mask ventilation without difficulty LMA: LMA inserted LMA Size: 3.0 Number of attempts: 1 Placement Confirmation: positive ETCO2 and breath sounds checked- equal and bilateral Tube secured with: Tape Dental Injury: Teeth and Oropharynx as per pre-operative assessment

## 2020-10-29 NOTE — Transfer of Care (Signed)
Immediate Anesthesia Transfer of Care Note  Patient: Ashley Choi  Procedure(s) Performed: HARDWARE REMOVAL (Right: Pelvis)  Patient Location: PACU  Anesthesia Type:General  Level of Consciousness: awake, drowsy and patient cooperative  Airway & Oxygen Therapy: Patient Spontanous Breathing  Post-op Assessment: Report given to RN, Post -op Vital signs reviewed and stable and Patient moving all extremities X 4  Post vital signs: Reviewed and stable  Last Vitals:  Vitals Value Taken Time  BP 139/86 10/29/20 0834  Temp    Pulse 53 10/29/20 0837  Resp 12 10/29/20 0837  SpO2 99 % 10/29/20 0837  Vitals shown include unvalidated device data.  Last Pain:  Vitals:   10/29/20 0609  TempSrc:   PainSc: 8       Patients Stated Pain Goal: 3 (10/29/20 9381)  Complications: No notable events documented.

## 2020-10-29 NOTE — Anesthesia Preprocedure Evaluation (Signed)
Anesthesia Evaluation  Patient identified by MRN, date of birth, ID band Patient awake    Reviewed: Allergy & Precautions, NPO status , Patient's Chart, lab work & pertinent test results  Airway Mallampati: I  TM Distance: >3 FB Neck ROM: Full    Dental no notable dental hx.    Pulmonary Current Smoker and Patient abstained from smoking.,    Pulmonary exam normal breath sounds clear to auscultation       Cardiovascular Normal cardiovascular exam Rhythm:Regular Rate:Normal     Neuro/Psych Depression    GI/Hepatic   Endo/Other    Renal/GU      Musculoskeletal  (+) Arthritis , Osteoarthritis,    Abdominal   Peds  Hematology   Anesthesia Other Findings   Reproductive/Obstetrics                             Anesthesia Physical  Anesthesia Plan  ASA: 2  Anesthesia Plan: General   Post-op Pain Management:    Induction: Intravenous  PONV Risk Score and Plan: 2 and Ondansetron, Midazolam and Treatment may vary due to age or medical condition  Airway Management Planned: Oral ETT and LMA  Additional Equipment:   Intra-op Plan:   Post-operative Plan: Extubation in OR  Informed Consent: I have reviewed the patients History and Physical, chart, labs and discussed the procedure including the risks, benefits and alternatives for the proposed anesthesia with the patient or authorized representative who has indicated his/her understanding and acceptance.     Dental advisory given  Plan Discussed with: CRNA and Surgeon  Anesthesia Plan Comments:         Anesthesia Quick Evaluation

## 2020-10-31 ENCOUNTER — Encounter (HOSPITAL_COMMUNITY): Payer: Self-pay | Admitting: Student

## 2021-04-13 ENCOUNTER — Ambulatory Visit: Payer: Self-pay | Admitting: Pain Medicine
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
## 2022-10-11 ENCOUNTER — Other Ambulatory Visit (HOSPITAL_COMMUNITY)
Admission: RE | Admit: 2022-10-11 | Discharge: 2022-10-11 | Disposition: A | Discharge status: Home / Self Care | Payer: Self-pay | Source: Ambulatory Visit | Attending: Nurse Practitioner | Admitting: Nurse Practitioner

## 2022-10-11 DIAGNOSIS — Z124 Encounter for screening for malignant neoplasm of cervix: Secondary | ICD-10-CM | POA: Insufficient documentation

## 2022-10-11 DIAGNOSIS — Z0000 Encounter for general adult medical examination without abnormal findings: Secondary | ICD-10-CM | POA: Insufficient documentation
